# Patient Record
Sex: Male | Born: 1985 | Race: Black or African American | Hispanic: No | Marital: Single | Smoking: Current some day smoker
Health system: Southern US, Community
[De-identification: ages and names within clinical notes are randomized; demographics above are authoritative.]

## PROBLEM LIST (undated history)

## (undated) DIAGNOSIS — R51 Headache: Secondary | ICD-10-CM

## (undated) DIAGNOSIS — R519 Headache, unspecified: Secondary | ICD-10-CM

## (undated) DIAGNOSIS — S86819A Strain of other muscle(s) and tendon(s) at lower leg level, unspecified leg, initial encounter: Secondary | ICD-10-CM

## (undated) DIAGNOSIS — F209 Schizophrenia, unspecified: Secondary | ICD-10-CM

## (undated) DIAGNOSIS — W3400XA Accidental discharge from unspecified firearms or gun, initial encounter: Secondary | ICD-10-CM

## (undated) DIAGNOSIS — R569 Unspecified convulsions: Secondary | ICD-10-CM

## (undated) HISTORY — PX: LEG SURGERY: SHX1003

## (undated) HISTORY — PX: EYE SURGERY: SHX253

---
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
## 2005-01-02 ENCOUNTER — Encounter: Admission: RE | Admit: 2005-01-02 | Discharge: 2005-01-02 | Payer: Self-pay | Admitting: Occupational Medicine

## 2005-02-19 ENCOUNTER — Emergency Department (HOSPITAL_COMMUNITY): Admission: EM | Admit: 2005-02-19 | Discharge: 2005-02-19 | Payer: Self-pay | Admitting: Emergency Medicine

## 2005-02-20 ENCOUNTER — Ambulatory Visit: Payer: Self-pay | Admitting: Psychiatry

## 2005-02-20 ENCOUNTER — Inpatient Hospital Stay (HOSPITAL_COMMUNITY): Admission: EM | Admit: 2005-02-20 | Discharge: 2005-02-23 | Payer: Self-pay | Admitting: Psychiatry

## 2008-03-29 ENCOUNTER — Ambulatory Visit: Payer: Self-pay | Admitting: Family Medicine

## 2008-03-29 DIAGNOSIS — Z8659 Personal history of other mental and behavioral disorders: Secondary | ICD-10-CM | POA: Insufficient documentation

## 2009-04-15 ENCOUNTER — Ambulatory Visit: Payer: Self-pay | Admitting: Family Medicine

## 2009-04-15 ENCOUNTER — Encounter: Payer: Self-pay | Admitting: Family Medicine

## 2009-04-15 DIAGNOSIS — R630 Anorexia: Secondary | ICD-10-CM

## 2009-04-15 DIAGNOSIS — R634 Abnormal weight loss: Secondary | ICD-10-CM

## 2009-04-15 LAB — CONVERTED CEMR LAB
ALT: 9 units/L (ref 0–53)
AST: 12 units/L (ref 0–37)
Albumin: 4.2 g/dL (ref 3.5–5.2)
Alkaline Phosphatase: 60 units/L (ref 39–117)
BUN: 13 mg/dL (ref 6–23)
CO2: 29 meq/L (ref 19–32)
Calcium: 9.3 mg/dL (ref 8.4–10.5)
Chloride: 103 meq/L (ref 96–112)
Cholesterol: 119 mg/dL (ref 0–200)
Creatinine, Ser: 1.16 mg/dL (ref 0.40–1.50)
Glucose, Bld: 91 mg/dL (ref 70–99)
HCT: 40.2 % (ref 39.0–52.0)
HDL: 38 mg/dL — ABNORMAL LOW (ref >39)
Hemoglobin: 14 g/dL (ref 13.0–17.0)
LDL Cholesterol: 73 mg/dL (ref 0–99)
MCHC: 34.8 g/dL (ref 30.0–36.0)
MCV: 100.5 fL — ABNORMAL HIGH (ref 78.0–100.0)
Platelets: 204 10*3/uL (ref 150–400)
Potassium: 3.9 meq/L (ref 3.5–5.3)
RBC: 4 M/uL — ABNORMAL LOW (ref 4.22–5.81)
RDW: 12.4 % (ref 11.5–15.5)
Sodium: 142 meq/L (ref 135–145)
TSH: 0.525 microintl units/mL (ref 0.350–4.500)
Total Bilirubin: 0.6 mg/dL (ref 0.3–1.2)
Total CHOL/HDL Ratio: 3.1
Total Protein: 6.7 g/dL (ref 6.0–8.3)
Triglycerides: 39 mg/dL (ref <150)
VLDL: 8 mg/dL (ref 0–40)
WBC: 4.1 10*3/uL (ref 4.0–10.5)

## 2009-04-27 ENCOUNTER — Encounter: Payer: Self-pay | Admitting: Family Medicine

## 2009-08-02 ENCOUNTER — Encounter (INDEPENDENT_AMBULATORY_CARE_PROVIDER_SITE_OTHER): Payer: Self-pay | Admitting: *Deleted

## 2009-08-02 DIAGNOSIS — F17200 Nicotine dependence, unspecified, uncomplicated: Secondary | ICD-10-CM

## 2011-01-10 ENCOUNTER — Encounter: Payer: Self-pay | Admitting: *Deleted

## 2011-04-12 ENCOUNTER — Inpatient Hospital Stay (INDEPENDENT_AMBULATORY_CARE_PROVIDER_SITE_OTHER)
Admission: RE | Admit: 2011-04-12 | Discharge: 2011-04-12 | Disposition: A | Discharge status: Home / Self Care | Payer: Medicaid Other | Source: Ambulatory Visit | Attending: Emergency Medicine | Admitting: Emergency Medicine

## 2011-04-12 DIAGNOSIS — B86 Scabies: Secondary | ICD-10-CM

## 2012-06-14 ENCOUNTER — Emergency Department (HOSPITAL_COMMUNITY)
Admission: EM | Admit: 2012-06-14 | Discharge: 2012-06-14 | Disposition: A | Discharge status: Home / Self Care | Payer: Medicaid Other | Attending: Emergency Medicine | Admitting: Emergency Medicine

## 2012-06-14 DIAGNOSIS — L600 Ingrowing nail: Secondary | ICD-10-CM | POA: Insufficient documentation

## 2012-06-14 DIAGNOSIS — IMO0002 Reserved for concepts with insufficient information to code with codable children: Secondary | ICD-10-CM

## 2012-06-14 DIAGNOSIS — B351 Tinea unguium: Secondary | ICD-10-CM

## 2012-06-14 MED ORDER — TRAMADOL HCL 50 MG PO TABS
50.0000 mg | ORAL_TABLET | Freq: Once | ORAL | Status: AC
  Administered 2012-06-14: 50 mg via ORAL
  Filled 2012-06-14: qty 1

## 2012-06-14 MED ORDER — TRAMADOL HCL 50 MG PO TABS: 50.0000 mg | ORAL_TABLET | Freq: Four times a day (QID) | ORAL | Status: AC | PRN

## 2012-06-14 MED ORDER — CICLOPIROX & LACQUER REMOVAL 8 % EX KIT: 1.0000 "application " | PACK | Freq: Two times a day (BID) | CUTANEOUS | Status: DC

## 2012-06-14 MED ORDER — SULFAMETHOXAZOLE-TRIMETHOPRIM 800-160 MG PO TABS: 1.0000 | ORAL_TABLET | Freq: Two times a day (BID) | ORAL | Status: AC

## 2012-06-14 NOTE — ED Notes (Signed)
sts he has an infected toenail, hx of toe inj, sts stumped toe yesterday and now purple

## 2012-06-14 NOTE — ED Provider Notes (Signed)
History   This chart was scribed for Jerry Munch, MD by Charolett Bumpers . The patient was seen in room TR09C/TR09C. Patient's care was started at 1342.    CSN: [id number]  Arrival date & time 06/14/12  1323   First MD Initiated Contact with Patient 06/14/12 1342      Chief Complaint  Patient presents with  . Ingrown Toenail    (Consider location/radiation/quality/duration/timing/severity/associated sxs/prior treatment) HPI MANSOUR BALBOA is a 26 y.o. male who presents to the Emergency Department complaining of constant, moderate right great toe pain that with increasing pain since yesterday after stumping his toe. Pt reports associated swelling and ecchymosis. Pt reports that he has had toe pain and a fungal growth over the past year. Pt denies taking any medications for pain. Pt denies any prior medical hx. Pt denies any recent n/v, fevers or chills. Pt denies any h/o HTN or diabetes. Pt denies seeing a podiatrist previously for these symptoms. Pt states that he believes that his toenail  is infected.   No past medical history on file.  No past surgical history on file.  No family history on file.  History  Substance Use Topics  . Smoking status: Not on file  . Smokeless tobacco: Not on file  . Alcohol Use: Not on file      Review of Systems  Constitutional:       Per HPI, otherwise negative  HENT:       Per HPI, otherwise negative  Eyes: Negative.   Respiratory:       Per HPI, otherwise negative  Cardiovascular:       Per HPI, otherwise negative  Gastrointestinal: Negative for vomiting.  Genitourinary: Negative.   Musculoskeletal: Positive for arthralgias.       Per HPI, otherwise negative  Skin: Negative.   Neurological: Negative for syncope.    Allergies  Review of patient's allergies indicates no known allergies.  Home Medications   Current Outpatient Rx  Name Route Sig Dispense Refill  . BENZTROPINE MESYLATE 1 MG PO TABS Oral Take 1 mg  by mouth 2 (two) times daily.      BP 125/65  Pulse 57  Temp 98.9 F (37.2 C) (Oral)  Resp 18  SpO2 98%  Physical Exam  Nursing note and vitals reviewed. Constitutional: He is oriented to person, place, and time. He appears well-developed. No distress.  HENT:  Head: Normocephalic and atraumatic.  Eyes: Conjunctivae and EOM are normal.  Cardiovascular: Normal rate, regular rhythm and intact distal pulses.   Pulmonary/Chest: Effort normal. No stridor. No respiratory distress.  Abdominal: He exhibits no distension.  Musculoskeletal: He exhibits no edema.       Right great toe, neurovascularly intact. Tenderness at base of right great toe with signs of infection. Fungal growth on great toenail.   Neurological: He is alert and oriented to person, place, and time.  Skin: Skin is warm and dry.  Psychiatric: He has a normal mood and affect.    ED Course  Procedures (including critical care time)  DIAGNOSTIC STUDIES: Oxygen Saturation is 98% on room air, normal by my interpretation.    COORDINATION OF CARE:  15:42-Discussed planned course of treatment with the patient including epsom salt soaks, fungal cream and abx, who is agreeable at this time.   16:00-Medication Orders: Tramadol (Ultram) tablet 500 mg-once.   Labs Reviewed - No data to display No results found.   No diagnosis found.    MDM  I personally performed  the services described in this documentation, which was scribed in my presence. The recorded information has been reviewed and considered.  The patient presents with concern over new changes to a chronically infected toe.  On exam the patient is in no distress with unremarkable vital signs, and no fever.  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  There is no fluctuance or drainable collection near the nail matrix, but there is concern for early paronychia.  I discussed all findings  with the patient.  He was discharged with return precautions, analgesics, antifungal cream, and podiatry followup.  Jerry Munch, MD 06/14/12 1710

## 2014-03-08 ENCOUNTER — Encounter (HOSPITAL_COMMUNITY): Payer: Self-pay | Admitting: Emergency Medicine

## 2014-03-08 ENCOUNTER — Emergency Department (HOSPITAL_COMMUNITY)
Admission: EM | Admit: 2014-03-08 | Discharge: 2014-03-08 | Disposition: A | Discharge status: Home / Self Care | Payer: Medicaid Other | Attending: Emergency Medicine | Admitting: Emergency Medicine

## 2014-03-08 DIAGNOSIS — K0381 Cracked tooth: Secondary | ICD-10-CM | POA: Insufficient documentation

## 2014-03-08 DIAGNOSIS — F17200 Nicotine dependence, unspecified, uncomplicated: Secondary | ICD-10-CM | POA: Insufficient documentation

## 2014-03-08 DIAGNOSIS — K029 Dental caries, unspecified: Secondary | ICD-10-CM

## 2014-03-08 DIAGNOSIS — Z8659 Personal history of other mental and behavioral disorders: Secondary | ICD-10-CM | POA: Insufficient documentation

## 2014-03-08 DIAGNOSIS — K089 Disorder of teeth and supporting structures, unspecified: Secondary | ICD-10-CM | POA: Insufficient documentation

## 2014-03-08 HISTORY — DX: Schizophrenia, unspecified: F20.9

## 2014-03-08 MED ORDER — OXYCODONE-ACETAMINOPHEN 5-325 MG PO TABS
2.0000 | ORAL_TABLET | Freq: Once | ORAL | Status: AC
  Administered 2014-03-08: 1 via ORAL
  Filled 2014-03-08: qty 2

## 2014-03-08 MED ORDER — IBUPROFEN 400 MG PO TABS
800.0000 mg | ORAL_TABLET | Freq: Once | ORAL | Status: AC
  Administered 2014-03-08: 800 mg via ORAL
  Filled 2014-03-08: qty 2

## 2014-03-08 MED ORDER — OXYCODONE-ACETAMINOPHEN 5-325 MG PO TABS: 1.0000 | ORAL_TABLET | Freq: Four times a day (QID) | ORAL | Status: DC | PRN

## 2014-03-08 MED ORDER — PENICILLIN V POTASSIUM 500 MG PO TABS: 500.0000 mg | ORAL_TABLET | Freq: Four times a day (QID) | ORAL | Status: DC

## 2014-03-08 NOTE — ED Provider Notes (Signed)
CSN: [id number]     Arrival date & time 03/08/14  1736 History  This chart was scribed for non-physician practitioner Junius FinnerErin O'Malley, PA-C working with Dagmar HaitWilliam Blair Walden, MD by Joaquin MusicKristina Sanchez-Matthews, ED Scribe. This patient was seen in room TR10C/TR10C and the patient's care was started at 6:58 PM .   Chief Complaint  Patient presents with  . Dental Injury   The history is provided by the patient. No language interpreter was used.   HPI Comments: Jerry [name] is a 28 y.o. male who presents to the Emergency Department complaining of dental pain to L lower molars due to dental injury that occurred this afternoon. Pain is constant, aching, throbbing, 10/10.  Mother of pt states he "chipped his tooth down to the nerve" while eating chips. Pt denies taking any OTC medications PTA. He denies being allergic to any medications. Pt denies drooling and facial swelling.  Past Medical History  Diagnosis Date  . Schizophrenia    History reviewed. No pertinent past surgical history. No family history on file. History  Substance Use Topics  . Smoking status: Current Every Day Smoker  . Smokeless tobacco: Not on file  . Alcohol Use: No    Review of Systems  HENT: Positive for dental problem. Negative for drooling and facial swelling.   Skin: Negative for wound.   Allergies  Review of patient's allergies indicates no known allergies.  Home Medications   Prior to Admission medications   Not on File   BP 134/99  Pulse 70  Temp(Src) 98 F (36.7 C) (Axillary)  Resp 17  Wt 189 lb (85.73 kg)  SpO2 100%  Physical Exam  Nursing note and vitals reviewed. Constitutional: He is oriented to person, place, and time. He appears well-developed and well-nourished.  HENT:  Head: Normocephalic and atraumatic.  Mouth/Throat:    Cracked tooth down to dentin. Tender to palpation. No discharge or bleeding. No foreign body. No surrounding edema.   Eyes: EOM are normal.  Neck: Normal range of  motion.  Cardiovascular: Normal rate.   Pulmonary/Chest: Effort normal.  Musculoskeletal: Normal range of motion.  Neurological: He is alert and oriented to person, place, and time.  Skin: Skin is warm and dry.  Psychiatric: He has a normal mood and affect. His behavior is normal.   ED Course  Procedures  DIAGNOSTIC STUDIES: Oxygen Saturation is 100% on RA, normal by my interpretation.    COORDINATION OF CARE: 7:00 PM-Discussed treatment plan which includes discharge pt with pain medication and antibiotics. Will give pt pain medication while in ED and discharge pt with a resource guide. Pt agreed to plan.   Labs Review Labs Reviewed - No data to display  Imaging Review No results found.   EKG Interpretation None     MDM   Final diagnoses:  Pain due to dental caries  Cracked tooth    Pt presenting to ED with dental pain. No evidence of dental abscess. Will tx with pain medication. Advised to f/u with Dr. Leanord AsalFarless, DDS. Return precautions provided. Pt verbalized understanding and agreement with tx plan.   I personally performed the services described in this documentation, which was scribed in my presence. The recorded information has been reviewed and is accurate.    Junius Finnerrin O'Malley, PA-C 03/08/14 1913

## 2014-03-08 NOTE — Discharge Instructions (Signed)
Dental Caries Dental caries is tooth decay. This decay can cause a hole in teeth (cavity) that can get bigger and deeper over time. HOME CARE  Brush and floss your teeth. Do this at least two times a day.  Use a fluoride toothpaste.  Use a mouth rinse if told by your dentist or doctor.  Eat less sugary and starchy foods. Drink less sugary drinks.  Avoid snacking often on sugary and starchy foods. Avoid sipping often on sugary drinks.  Keep regular checkups and cleanings with your dentist.  Use fluoride supplements if told by your dentist or doctor.  Allow fluoride to be applied to teeth if told by your dentist or doctor. MAKE SURE YOU:  Understand these instructions.  Will watch your condition.  Will get help right away if you are not doing well or get worse. Document Released: 08/14/2008 Document Revised: 07/08/2013 Document Reviewed: 11/07/2012 [facility] Patient Information 2014 [place]Care, [place]LLC.  Dental Fracture You have a dental fracture or injury. This can mean the tooth is loose, has a chip in the enamel or is broken. If just the outer enamel is chipped, there is a good chance the tooth will not become infected. The only treatment needed may be to smooth off a rough edge. Fractures into the deeper layers (dentin and pulp) cause greater pain and are more likely to become infected. These require you to see a dentist as soon as possible to save the tooth. Loose teeth may need to be wired or bonded with a plastic splint to hold them in place. A paste may be painted on the open area of the broken tooth to reduce the pain. Antibiotics and pain medicine may be prescribed. Choosing a soft or liquid diet and rinsing the mouth out with warm water after meals may be helpful. See your dentist as recommended. Failure to seek care or follow up with a dentist or other specialist as recommended could result in the loss of your tooth, infection, or permanent dental problems. SEEK MEDICAL CARE IF:    You have increased pain not controlled with medicines.  You have swelling around the tooth, in the face or neck.  You have bleeding which starts, continues, or gets worse.  You have a fever. Document Released: 12/13/2004 Document Revised: 01/28/2012 Document Reviewed: 09/27/2009 [facility] Patient Information 2014 [place]ExitCare, [place]LLC.  Emergency Department Resource Guide 1) Find a Doctor and Pay Out of Pocket Although you won't have to find out who is covered by your insurance plan, it is a good idea to ask around and get recommendations. You will then need to call the office and see if the doctor you have chosen will accept you as a new patient and what types of options they offer for patients who are self-pay. Some doctors offer discounts or will set up payment plans for their patients who do not have insurance, but you will need to ask so you aren't surprised when you get to your appointment.  2) Contact Your Local Health Department Not all health departments have doctors that can see patients for sick visits, but many do, so it is worth a call to see if yours does. If you don't know where your local health department is, you can check in your phone book. The CDC also has a tool to help you locate your state's health department, and many state websites also have listings of all of their local health departments.  3) Find a Walk-in Clinic If your illness is not likely to  be very severe or complicated, you may want to try a walk in clinic. These are popping up all over the country in pharmacies, drugstores, and shopping centers. They're usually staffed by nurse practitioners or physician assistants that have been trained to treat common illnesses and complaints. They're usually fairly quick and inexpensive. However, if you have serious medical issues or chronic medical problems, these are probably not your best option.  No Primary Care Doctor: - Call Health Connect at  [phone] - they can help  you locate a primary care doctor that  accepts your insurance, provides certain services, etc. - Physician Referral Service- [phone]  Chronic Pain Problems: Organization         Address  Phone   Notes  [facility]  [phone] Patients need to be referred by their primary care doctor.   Medication Assistance: Organization         Address  Phone   Notes  [facility] Medication [facility] [address] [place] [place], [place] [address] [phone] --Must be a resident of [facility] -- Must have NO insurance coverage whatsoever (no Medicaid/ Medicare, etc.) -- The pt. MUST have a primary care doctor that directs their care regularly and follows them in the community   Med[facility]ist  [phone]   [employer]  [phone]    Agencies that provide inexpensive medical care: Organization         Address                                                       Phone                                                                            Notes  [facility]  [phone]   [facility]    [phone]   [facility] [address] [place], [place] [address] [phone]   [facility] [address] [place], [place] [phone]   Planned Parenthood    [phone]   [facility]    [phone]   [facility]  [address], [place] Phone:  [phone], Fax:  [phone] Hours of Operation:  9 am - 6 pm, M-F.  Also accepts Medicaid/Medicare and self-pay.  [facility]  [address], [place] Phone: [phone], Fax: [phone]. Hours of Operation:  8:30 am - 5:30 pm, M-F.  Also accepts Medicaid and self-pay.  [facility] [place] [address], [place] Phone: [phone]   [facility] [address] Natasha Bence [place], [place] [phone], Ext. [phone]  Mondays & Thursdays: 7-9 AM.  First 15 patients are seen on a first come, first serve basis.    Medicaid-accepting [facility] Providers:  Organization         Address  Phone                               Notes  [facility] [address], [place] [phone] Also accepts self-pay patients.  [facility] [address], [place]  [phone](336) [phone]   [facility] [address], [place] [phone]) [phone]   [facility] [address] [place] [address], [place] [phone](336) [phone]   Renaye RakersVeita Bland [address], [place]   [phone] Only accepts [place] Access [place]icaid patients after they have their name applied to their card.   Self-Pay (no insurance) in [facility]:   Organization         Address                                                     Phone               Notes  Sickle Cell Patients, [facility] [address], [place] [phone]) [phone]   [facility] [address], [place] [phone](336) [phone]   [facility] [place]  [address], [place] [phone](336) [phone]   [facility]/Dr. Osei-Bonsu  [address] [place] [address], [place] or [phone], [place] [phone]) [phone] Phone number for both [place] and [place] locations is the same.  [facility] [address], [place] [phone]) [phone]   [facility] [place] [address] [place] [address], [place] or [address] [phone]) [phone]) [phone]   [facility] [address], [place] [phone](336) [phone], phone; [phone](336) [phone], fax Sees patients 1st and 3rd Saturday of every month.  Must not qualify for public or private insurance (i.e. Medicaid, Medicare, [place] Health Choice, Veterans' Benefits)  Household income should be no  more than 200% of the poverty level The clinic cannot treat you if you are pregnant or think you are pregnant  Sexually transmitted diseases are not treated at the clinic.    Dental Care: Organization         Address                                  Phone                       Notes  [facility] of [facility] [address], [place] [phone](336) [phone] Accepts children up to age 29 who are enrolled in [place]icaid or [place] Health Choice; pregnant women with a Medicaid card; and children who have applied for Medicaid or [place] Health Choice, but were declined, whose parents can pay a reduced fee at time of service.  [facility] of [facility] [place]  [address], [place] [phone]) [phone] Accepts children up to age 29 who are enrolled in [place]icaid or [place] Health Choice; pregnant women with a Medicaid card; and children who have applied for Medicaid or  Health Choice, but were declined, whose parents can pay a reduced fee at time of  service.  [facility]  [address] [place], [place] [phone] Patients are seen by appointment only. Walk-ins are not accepted. [facility] will see patients 16 years of age and older. Monday - Tuesday (8am-5pm) Most Wednesdays (8:30-5pm) $30 per visit, cash only  [facility] Access PRO[facility]  [address], [facility] [phone] Patients are seen by appointment only. Walk-ins are not accepted. [facility] will see patients 52 years of age and older. One Wednesday Evening (Monthly: Volunteer Based).  $30 per visit, cash only  [employer]  [phone] for adults; Children under age 43, call Graduate Pediatric Dentistry at [phone]. Children aged 68-14, please call [phone] to request a pediatric application.  Dental services are provided in all areas of dental care including fillings, crowns and bridges, complete and  partial dentures, implants, gum treatment, root canals, and extractions. Preventive care is also provided. Treatment is provided to both adults and children. Patients are selected via a lottery and there is often a waiting list.   [facility] [address] [place]  [phone] [email or web address]   [facility] [address] [place], [place] [phone], Ext. [phone] Second and Fourth Thursday of each month, opens at 6:30 AM; Clinic ends at 9 AM.  Patients are seen on a first-come first-served basis, and a limited number are seen during each clinic.   [facility]  [address] [place], [place] [phone]   Eligibility Requirements You must have lived in [place], [place], or [place] counties for at least the last three months.   You cannot be eligible for state or federal sponsored [place], including [employer], [place], or [employer].   You generally cannot be eligible for healthcare insurance through your employer.    How to apply: Eligibility screenings are held every Tuesday and Wednesday afternoon from 1:00 pm until 4:00 pm. You do not need an appointment for the interview!  [facility] [address] [place], [place] [phone]   [facility] Health Department  [phone]   [facility]  [phone]   [facility]  [phone]    Behavioral Health Resources in the Community: Intensive Outpatient Programs Organization         Address                                              Phone              Notes  [facility] Services [address] [place], [place] [phone]   [facility]patient [address], [place], [place] [phone]   [facility]: [facility] [address] [place], [place]  [phone]   [facility] Mental Health [address]  [place], [place] [phone] or [phone]     Substance Abuse Resources Organization         Address                                Phone  Notes  [facility] Drug Services  [phone]   [facility]  [phone]   [facility] [place]  [phone]   [facility]  [phone]   Residential &  Outpatient Substance Abuse Program  [phone]   Psychological Services Organization         Address                                  Phone                Notes  [facility] Health  [phone]   [facility] Services  [phone]   [facility] [phone] [place] [phone] or [phone]    Mobile Crisis Teams Organization         Address  Phone  Notes  [facility], Mobile Crisis Care Unit  [phone]   [facility]  [address] [place], [place] [phone]   Doristine Locks [address] [place] [phone]    Self-Help/Support Groups Organization         Address                         Phone             Notes  [facility] of [place] - variety of support groups  336- I7437963 Call for more information  Narcotics Anonymous (NA), [facility] [address], [employer] [place]  2 meetings at this location   Statistician         Address                                                    Phone              Notes  [facility] [address],    [place]  [phone]   [facility]  [address] [place] [address], [place], [place] [phone]   [facility] [address] [place], [place] [phone] Admissions: 8am-3pm M-F  [facility] [address],    [place], [place] [phone]   [facility] [address] [place], [place], [place] [phone]   [facility] [address],  [place], [place] [phone]   [facility] - Intensive Outpatient [address], [place],  [place] [phone]   [facility] ([facility]iction [facility] [address] [place]  [place], [place] [phone] or [phone]   [facility] ([facility]) [address], [place], [place] [phone] Accepts Medicaid  [facility] [address]  [place] [phone] Substance Abuse/Addiction Treatment   [facility] Organization         Address                                                            Phone                    Notes  [facility]  7244036614   Angie Fava, PhD [address] [place], [place]   [phone] or [phone]   [facility]   [address]  [address] [place], [place] [phone]   [facility] [address] [place], [place] [phone] Insurance/Medicaid/sponsorship through [facility] and Families [address]                                    [place], [place] [phone] Therapy/tele-psych/case  [facility] [address]   [place], [place] [phone]    Dr. Lolly Mustache  [phone]   [facility] [place]  [employer] [facility] Dept. 1) [address], [place] 2) [address], [place] 3)  [address], [place] [phone]  [phone]   [facility] Child Abuse Hotline (862)679-4109 or 3136053347 (After Hours)

## 2014-03-08 NOTE — ED Notes (Signed)
Pt reports eating prior to arrival and "cracking" bottom left molar- pt groaning in pain at present.

## 2014-03-11 NOTE — ED Provider Notes (Signed)
Medical screening examination/treatment/procedure(s) were performed by non-physician practitioner and as supervising physician I was immediately available for consultation/collaboration.   EKG Interpretation None        William Garlan Drewes, MD 03/11/14 1839 

## 2015-03-09 ENCOUNTER — Emergency Department (HOSPITAL_COMMUNITY)
Admission: EM | Admit: 2015-03-09 | Discharge: 2015-03-10 | Disposition: A | Discharge status: Other Acute Inpatient Hospital | Payer: Medicaid Other | Attending: Emergency Medicine | Admitting: Emergency Medicine

## 2015-03-09 ENCOUNTER — Encounter (HOSPITAL_COMMUNITY): Payer: Self-pay | Admitting: Emergency Medicine

## 2015-03-09 DIAGNOSIS — Z046 Encounter for general psychiatric examination, requested by authority: Secondary | ICD-10-CM | POA: Diagnosis present

## 2015-03-09 DIAGNOSIS — Z79899 Other long term (current) drug therapy: Secondary | ICD-10-CM | POA: Diagnosis not present

## 2015-03-09 DIAGNOSIS — F250 Schizoaffective disorder, bipolar type: Secondary | ICD-10-CM | POA: Diagnosis present

## 2015-03-09 DIAGNOSIS — Z720 Tobacco use: Secondary | ICD-10-CM | POA: Insufficient documentation

## 2015-03-09 DIAGNOSIS — F209 Schizophrenia, unspecified: Secondary | ICD-10-CM | POA: Diagnosis not present

## 2015-03-09 DIAGNOSIS — F319 Bipolar disorder, unspecified: Secondary | ICD-10-CM | POA: Diagnosis not present

## 2015-03-09 LAB — COMPREHENSIVE METABOLIC PANEL
ALT: 17 U/L (ref 0–53)
ANION GAP: 6 (ref 5–15)
AST: 22 U/L (ref 0–37)
Albumin: 4.4 g/dL (ref 3.5–5.2)
Alkaline Phosphatase: 83 U/L (ref 39–117)
BILIRUBIN TOTAL: 0.6 mg/dL (ref 0.3–1.2)
BUN: 14 mg/dL (ref 6–23)
CALCIUM: 9.3 mg/dL (ref 8.4–10.5)
CHLORIDE: 102 mmol/L (ref 96–112)
CO2: 31 mmol/L (ref 19–32)
Creatinine, Ser: 1.07 mg/dL (ref 0.50–1.35)
GFR calc non Af Amer: >90 mL/min (ref >90)
GLUCOSE: 106 mg/dL — AB (ref 70–99)
Potassium: 4 mmol/L (ref 3.5–5.1)
Sodium: 139 mmol/L (ref 135–145)
Total Protein: 7.7 g/dL (ref 6.0–8.3)

## 2015-03-09 LAB — ETHANOL

## 2015-03-09 LAB — CBC
HEMATOCRIT: 44.4 % (ref 39.0–52.0)
Hemoglobin: 15 g/dL (ref 13.0–17.0)
MCH: 34.4 pg — ABNORMAL HIGH (ref 26.0–34.0)
MCHC: 33.8 g/dL (ref 30.0–36.0)
MCV: 101.8 fL — ABNORMAL HIGH (ref 78.0–100.0)
Platelets: 209 10*3/uL (ref 150–400)
RBC: 4.36 MIL/uL (ref 4.22–5.81)
RDW: 12.3 % (ref 11.5–15.5)
WBC: 6.5 10*3/uL (ref 4.0–10.5)

## 2015-03-09 LAB — RAPID URINE DRUG SCREEN, HOSP PERFORMED
AMPHETAMINES: NOT DETECTED
BARBITURATES: NOT DETECTED
BENZODIAZEPINES: NOT DETECTED
Cocaine: NOT DETECTED
Opiates: NOT DETECTED
Tetrahydrocannabinol: NOT DETECTED

## 2015-03-09 LAB — SALICYLATE LEVEL

## 2015-03-09 LAB — ACETAMINOPHEN LEVEL: Acetaminophen (Tylenol), Serum: <10 ug/mL — ABNORMAL LOW (ref 10–30)

## 2015-03-09 MED ORDER — ZOLPIDEM TARTRATE 5 MG PO TABS: 5.0000 mg | ORAL_TABLET | Freq: Every evening | ORAL | Status: DC | PRN

## 2015-03-09 MED ORDER — ALUM & MAG HYDROXIDE-SIMETH 200-200-20 MG/5ML PO SUSP: 30.0000 mL | ORAL | Status: DC | PRN

## 2015-03-09 MED ORDER — ONDANSETRON HCL 4 MG PO TABS: 4.0000 mg | ORAL_TABLET | Freq: Three times a day (TID) | ORAL | Status: DC | PRN

## 2015-03-09 MED ORDER — ARIPIPRAZOLE ER 400 MG IM SUSR: 400.0000 mg | INTRAMUSCULAR | Status: DC

## 2015-03-09 MED ORDER — LORAZEPAM 1 MG PO TABS: 1.0000 mg | ORAL_TABLET | Freq: Three times a day (TID) | ORAL | Status: DC | PRN

## 2015-03-09 MED ORDER — NICOTINE 21 MG/24HR TD PT24: 21.0000 mg | MEDICATED_PATCH | Freq: Every day | TRANSDERMAL | Status: DC

## 2015-03-09 MED ORDER — IBUPROFEN 200 MG PO TABS: 600.0000 mg | ORAL_TABLET | Freq: Three times a day (TID) | ORAL | Status: DC | PRN
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
## 2015-03-09 NOTE — ED Notes (Signed)
Bed: WBH37 Expected date:  Expected time:  Means of arrival:  Comments: Tr 4 
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
## 2015-03-10 ENCOUNTER — Inpatient Hospital Stay (HOSPITAL_COMMUNITY)
Admission: EM | Admit: 2015-03-10 | Discharge: 2015-03-15 | DRG: 885 | Disposition: A | Discharge status: Home / Self Care | Payer: Medicaid Other | Source: Intra-hospital | Attending: Psychiatry | Admitting: Psychiatry

## 2015-03-10 ENCOUNTER — Encounter (HOSPITAL_COMMUNITY): Payer: Self-pay

## 2015-03-10 DIAGNOSIS — F1010 Alcohol abuse, uncomplicated: Secondary | ICD-10-CM | POA: Diagnosis present

## 2015-03-10 DIAGNOSIS — F419 Anxiety disorder, unspecified: Secondary | ICD-10-CM | POA: Diagnosis present

## 2015-03-10 DIAGNOSIS — G4700 Insomnia, unspecified: Secondary | ICD-10-CM | POA: Diagnosis present

## 2015-03-10 DIAGNOSIS — F17210 Nicotine dependence, cigarettes, uncomplicated: Secondary | ICD-10-CM | POA: Diagnosis present

## 2015-03-10 DIAGNOSIS — F250 Schizoaffective disorder, bipolar type: Secondary | ICD-10-CM

## 2015-03-10 DIAGNOSIS — F1210 Cannabis abuse, uncomplicated: Secondary | ICD-10-CM | POA: Diagnosis present

## 2015-03-10 DIAGNOSIS — F209 Schizophrenia, unspecified: Secondary | ICD-10-CM | POA: Insufficient documentation

## 2015-03-10 MED ORDER — TRAZODONE HCL 100 MG PO TABS: 100.0000 mg | ORAL_TABLET | Freq: Every day | ORAL | Status: DC

## 2015-03-10 MED ORDER — MAGNESIUM HYDROXIDE 400 MG/5ML PO SUSP: 30.0000 mL | Freq: Every day | ORAL | Status: DC | PRN

## 2015-03-10 MED ORDER — LORAZEPAM 2 MG/ML IJ SOLN: 2.0000 mg | Freq: Four times a day (QID) | INTRAMUSCULAR | Status: DC

## 2015-03-10 MED ORDER — OLANZAPINE 10 MG PO TBDP: 10.0000 mg | ORAL_TABLET | Freq: Three times a day (TID) | ORAL | Status: DC | PRN

## 2015-03-10 MED ORDER — LORAZEPAM 2 MG/ML IJ SOLN
2.0000 mg | Freq: Four times a day (QID) | INTRAMUSCULAR | Status: DC
  Filled 2015-03-10: qty 1

## 2015-03-10 MED ORDER — ALUM & MAG HYDROXIDE-SIMETH 200-200-20 MG/5ML PO SUSP: 30.0000 mL | ORAL | Status: DC | PRN

## 2015-03-10 MED ORDER — DIPHENHYDRAMINE HCL 50 MG/ML IJ SOLN
50.0000 mg | Freq: Once | INTRAMUSCULAR | Status: DC
  Filled 2015-03-10: qty 1

## 2015-03-10 MED ORDER — ARIPIPRAZOLE ER 400 MG IM SUSR: 400.0000 mg | INTRAMUSCULAR | Status: DC

## 2015-03-10 MED ORDER — LORAZEPAM 1 MG PO TABS: 1.0000 mg | ORAL_TABLET | ORAL | Status: DC | PRN

## 2015-03-10 MED ORDER — ZIPRASIDONE MESYLATE 20 MG IM SOLR: 20.0000 mg | INTRAMUSCULAR | Status: DC | PRN

## 2015-03-10 MED ORDER — ACETAMINOPHEN 325 MG PO TABS: 650.0000 mg | ORAL_TABLET | Freq: Four times a day (QID) | ORAL | Status: DC | PRN

## 2015-03-10 MED ORDER — BENZTROPINE MESYLATE 0.5 MG PO TABS
0.5000 mg | ORAL_TABLET | Freq: Two times a day (BID) | ORAL | Status: DC
  Administered 2015-03-11 – 2015-03-14 (×7): 0.5 mg via ORAL
  Filled 2015-03-10 (×9): qty 1

## 2015-03-10 MED ORDER — HALOPERIDOL LACTATE 5 MG/ML IJ SOLN
5.0000 mg | Freq: Once | INTRAMUSCULAR | Status: DC
  Filled 2015-03-10: qty 1

## 2015-03-10 MED ORDER — ARIPIPRAZOLE 5 MG PO TABS
5.0000 mg | ORAL_TABLET | Freq: Every day | ORAL | Status: DC
  Filled 2015-03-10: qty 1

## 2015-03-10 MED ORDER — ARIPIPRAZOLE 5 MG PO TABS
5.0000 mg | ORAL_TABLET | Freq: Every day | ORAL | Status: DC
  Administered 2015-03-11 – 2015-03-14 (×4): 5 mg via ORAL
  Filled 2015-03-10 (×5): qty 1

## 2015-03-10 MED ORDER — BENZTROPINE MESYLATE 1 MG PO TABS: 0.5000 mg | ORAL_TABLET | Freq: Two times a day (BID) | ORAL | Status: DC

## 2015-03-10 NOTE — ED Notes (Signed)
[employer] transport requested through [employer].

## 2015-03-10 NOTE — ED Notes (Signed)
Josephine NP aware and is going to speak to pt.

## 2015-03-10 NOTE — ED Notes (Signed)
Pt up in the hall wanting to leave.  IVC process explained to pt by CSW and this RN.  Pt does not seem to understand that the Dr. Has to release him from the IVC.  Pt declines PO medications ordered.

## 2015-03-10 NOTE — Consult Note (Signed)
[facility] Consult   Reason for Consult:  Schizoaffective disorder, Bipolar type Referring Physician: EDP Patient Identification: Jerry [name] MRN:  [id number] Principal Diagnosis: Schizoaffective disorder, bipolar type Diagnosis:   Patient Active Problem List   Diagnosis Date Noted  . Schizoaffective disorder, bipolar type [F25.0] 03/10/2015  . TOBACCO USER [Z72.0] 08/02/2009  . LOSS OF APPETITE [R63.0] 04/15/2009  . LOSS OF WEIGHT [R63.4] 04/15/2009  . PSYCHIATRIC DISORDER, HX OF [Z86.59] 03/29/2008    Total Time spent with patient: 1 hour  Subjective:   Jerry [name] is a 29 y.o. male patient admitted with Schizoaffective disorder, Bi[polar type.Marland Kitchen  HPI: AA male, 29 years old was evaluated for disorganized speech, rambling words with a hx of Schizoaffective disorder Bipolar type.  Patient was brought in by [facility] under IVC taken out by [facility].  Patient reported today that he has been off his Abilify injection for 4-5 months and gave no reason for not taking his injection.  Patient reported that he was hospitalized at [facility] for 5 days and while there he was given his Abilify injection.  Patient was brought  a danger to self and others.  Patient stated that he came in to the hospital to look for job and a Vitamin that cleanses body.   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  Patient reports that he sees the "Vibration of life".  Patient was last hospitalized at our [place] 2006.  Patient receives outpatient Psychiatric care at [facility].  He has been accepted for admission for safety and stabilization.Patient will be transferred as soon as room is available.  HPI Elements:   Location:  Schizoaffective disorder, Bipolar type. Quality:  severe. Severity:  severe. Timing:  acute. Duration:  Chronic mental illness. Context:  IVC by [facility] for disorganization.  Past Medical History:  Past Medical  History  Diagnosis Date  . Schizophrenia   . Bipolar disorder     Past Surgical History  Procedure Laterality Date  . Leg surgery Right    Family History: No family history on file. Social History:  History  Alcohol Use  . Yes     History  Drug Use  . Yes  . Special: Marijuana    History   Social History  . Marital Status: Single    Spouse Name: N/A  . Number of Children: N/A  . Years of Education: N/A   Social History Main Topics  . Smoking status: Current Every Day Smoker  . Smokeless tobacco: Not on file  . Alcohol Use: Yes  . Drug Use: Yes    Special: Marijuana  . Sexual Activity: Not on file   Other Topics Concern  . None   Social History Narrative   Additional Social History:    Pain Medications: See PTA List Prescriptions: See PTA List Over the Counter: See PTA List History of alcohol / drug use?: Yes Longest period of sobriety (when/how long): "Months at a time" Negative Consequences of Use:  (Denies) Withdrawal Symptoms:  (Denies) Name of Substance 1: THC 1 - Age of First Use: 7 1 - Amount (size/oz): "a quarter bag" 1 - Frequency: Daily 1 - Duration: 10+ years 1 - Last Use / Amount: Friday, 03/04/15                   Allergies:   Allergies  Allergen Reactions  . Risperdal [Risperidone] Other (See Comments)  On [facility] paperwork    Labs:  Results for orders placed or performed during the hospital encounter of 03/09/15 (from the past 48 hour(s))  Acetaminophen level     Status: Abnormal   Collection Time: 03/09/15  9:51 PM  Result Value Ref Range   Acetaminophen (Tylenol), Serum <10.0 (L) 10 - 30 ug/mL    Comment:        THERAPEUTIC CONCENTRATIONS VARY SIGNIFICANTLY. A RANGE OF 10-30 ug/mL MAY BE AN EFFECTIVE CONCENTRATION FOR MANY PATIENTS. HOWEVER, SOME ARE BEST TREATED AT CONCENTRATIONS OUTSIDE THIS RANGE. ACETAMINOPHEN CONCENTRATIONS >150 ug/mL AT 4 HOURS AFTER INGESTION AND >50 ug/mL AT 12 HOURS AFTER INGESTION  ARE OFTEN ASSOCIATED WITH TOXIC REACTIONS.   CBC     Status: Abnormal   Collection Time: 03/09/15  9:51 PM  Result Value Ref Range   WBC 6.5 4.0 - 10.5 K/uL   RBC 4.36 4.22 - 5.81 MIL/uL   Hemoglobin 15.0 13.0 - 17.0 g/dL   HCT 44.4 39.0 - 52.0 %   MCV 101.8 (H) 78.0 - 100.0 fL   MCH 34.4 (H) 26.0 - 34.0 pg   MCHC 33.8 30.0 - 36.0 g/dL   RDW 12.3 11.5 - 15.5 %   Platelets 209 150 - 400 K/uL  Comprehensive metabolic panel     Status: Abnormal   Collection Time: 03/09/15  9:51 PM  Result Value Ref Range   Sodium 139 135 - 145 mmol/L   Potassium 4.0 3.5 - 5.1 mmol/L   Chloride 102 96 - 112 mmol/L   CO2 31 19 - 32 mmol/L   Glucose, Bld 106 (H) 70 - 99 mg/dL   BUN 14 6 - 23 mg/dL   Creatinine, Ser 1.07 0.50 - 1.35 mg/dL   Calcium 9.3 8.4 - 10.5 mg/dL   Total Protein 7.7 6.0 - 8.3 g/dL   Albumin 4.4 3.5 - 5.2 g/dL   AST 22 0 - 37 U/L   ALT 17 0 - 53 U/L   Alkaline Phosphatase 83 39 - 117 U/L   Total Bilirubin 0.6 0.3 - 1.2 mg/dL   GFR calc non Af Amer >90 >90 mL/min   GFR calc Af Amer >90 >90 mL/min    Comment: (NOTE) The eGFR has been calculated using the CKD EPI equation. This calculation has not been validated in all clinical situations. eGFR's persistently <90 mL/min signify possible Chronic Kidney Disease.    Anion gap 6 5 - 15  Ethanol (ETOH)     Status: None   Collection Time: 03/09/15  9:51 PM  Result Value Ref Range   Alcohol, Ethyl (B) <5 0 - 9 mg/dL    Comment:        LOWEST DETECTABLE LIMIT FOR SERUM ALCOHOL IS 11 mg/dL FOR MEDICAL PURPOSES ONLY   Salicylate level     Status: None   Collection Time: 03/09/15  9:51 PM  Result Value Ref Range   Salicylate Lvl <9.1 2.8 - 20.0 mg/dL  Urine Drug Screen     Status: None   Collection Time: 03/09/15  9:53 PM  Result Value Ref Range   Opiates NONE DETECTED NONE DETECTED   Cocaine NONE DETECTED NONE DETECTED   Benzodiazepines NONE DETECTED NONE DETECTED   Amphetamines NONE DETECTED NONE DETECTED    Tetrahydrocannabinol NONE DETECTED NONE DETECTED   Barbiturates NONE DETECTED NONE DETECTED    Comment:        DRUG SCREEN FOR MEDICAL PURPOSES ONLY.  IF CONFIRMATION IS NEEDED FOR ANY PURPOSE, NOTIFY LAB  WITHIN 5 DAYS.        LOWEST DETECTABLE LIMITS FOR URINE DRUG SCREEN Drug Class       Cutoff (ng/mL) Amphetamine      1000 Barbiturate      200 Benzodiazepine   275 Tricyclics       170 Opiates          300 Cocaine          300 THC              50     Vitals: Blood pressure 118/72, pulse 81, temperature 98.6 F (37 C), temperature source Oral, resp. rate 17, SpO2 99 %.  Risk to Self: Suicidal Ideation: No Suicidal Intent: No Is patient at risk for suicide?: No Suicidal Plan?: No Access to Means: No What has been your use of drugs/alcohol within the last 12 months?: Daily THC use, occasional Etoh use How many times?:  (na) Other Self Harm Risks: Very psychotic Intentional Self Injurious Behavior: None Risk to Others: Homicidal Ideation: No Thoughts of Harm to Others: No (However, IVC paperwork states pt was aggressive at [facility]) Current Homicidal Intent: No Current Homicidal Plan: No Access to Homicidal Means: No History of harm to others?: No Assessment of Violence: None Noted Does patient have access to weapons?: No Criminal Charges Pending?: No Does patient have a court date: No Prior Inpatient Therapy: Prior Inpatient Therapy: Yes Prior Therapy Dates: 02/2005; And "as a kid" Prior Therapy Facilty/Provider(s): [place] and others unknown Reason for Treatment: SI; Psychosis Prior Outpatient Therapy: Prior Outpatient Therapy: No Prior Therapy Dates: na Prior Therapy Facilty/Provider(s): na Reason for Treatment: na  Current Facility-Administered Medications  Medication Dose Route Frequency Provider Last Rate Last Dose  . alum & mag hydroxide-simeth (MAALOX/MYLANTA) 200-200-20 MG/5ML suspension 30 mL  30 mL Oral PRN Larene Pickett, PA-C      . ARIPiprazole (ABILIFY)  tablet 5 mg  5 mg Oral Daily Dlisa Barnwell      . [START ON 03/28/2015] ARIPiprazole SUSR 400 mg  400 mg Intramuscular Q30 days Marvelous Woolford      . benztropine (COGENTIN) tablet 0.5 mg  0.5 mg Oral BID Marquasia Schmieder      . ibuprofen (ADVIL,MOTRIN) tablet 600 mg  600 mg Oral Q8H PRN Larene Pickett, PA-C      . LORazepam (ATIVAN) tablet 1 mg  1 mg Oral Q8H PRN Larene Pickett, PA-C      . nicotine (NICODERM CQ - dosed in mg/24 hours) patch 21 mg  21 mg Transdermal Daily Larene Pickett, PA-C   21 mg at 03/10/15 1029  . OLANZapine zydis (ZYPREXA) disintegrating tablet 10 mg  10 mg Oral Q8H PRN Jamel Holzmann      . ondansetron (ZOFRAN) tablet 4 mg  4 mg Oral Q8H PRN Larene Pickett, PA-C      . zolpidem [facility]) tablet 5 mg  5 mg Oral QHS PRN Larene Pickett, PA-C       Current Outpatient Prescriptions  Medication Sig Dispense Refill  . ARIPiprazole 400 MG SUSR Inject 400 mg into the muscle every 30 (thirty) days.    Marland Kitchen oxyCODONE-acetaminophen (PERCOCET/ROXICET) 5-325 MG per tablet Take 1-2 tablets by mouth every 6 (six) hours as needed for severe pain. (Patient not taking: Reported on 03/09/2015) 10 tablet 0  . penicillin v potassium (VEETID) 500 MG tablet Take 1 tablet (500 mg total) by mouth 4 (four) times daily. (Patient not taking: Reported on 03/09/2015) 40 tablet  0    Musculoskeletal: Strength & Muscle Tone: within normal limits Gait & Station: normal Patient leans: N/A  Psychiatric Specialty Exam:     Blood pressure 118/72, pulse 81, temperature 98.6 F (37 C), temperature source Oral, resp. rate 17, SpO2 99 %.There is no weight on file to calculate BMI.  General Appearance: Casual and Fairly Groomed  Eye Contact::  Good  Speech:  Clear and Coherent and Normal Rate  Volume:  Normal  Mood:  Anxious and Depressed  Affect:  Congruent, Depressed and Flat  Thought Process:  Circumstantial, Disorganized and Tangential  Orientation:  Full (Time, Place, and Person)  Thought Content:   Hallucinations: Auditory Visual  Suicidal Thoughts:  No  Homicidal Thoughts:  No  Memory:  Immediate;   Good Recent;   Fair Remote;   Fair  Judgement:  Impaired  Insight:  Shallow  Psychomotor Activity:  Restlessness  Concentration:  Poor  Recall:  NA  Fund of Knowledge:Fair  Language: Fair  Akathisia:  NA  Handed:  Right  AIMS (if indicated):     Assets:  Desire for Improvement  ADL's:  Intact  Cognition: Impaired,  Severe  Sleep:      Medical Decision Making: Review of Psycho-Social Stressors (1), Established Problem, Worsening (2), Review of Medication Regimen & Side Effects (2) and Review of New Medication or Change in Dosage (2)  Treatment Plan Summary: Daily contact with patient to assess and evaluate symptoms and progress in treatment, Medication management and Plan accepted for admission and will be transferred when bed is avaialbale.  Plan:  Recommend psychiatric Inpatient admission when medically cleared. Disposition: see above  Delfin Gant 03/10/2015 12:34 PM Patient seen face-to-face for psychiatric evaluation, chart reviewed and case discussed with the physician extender and developed treatment plan. Reviewed the information documented and agree with the treatment plan. Corena Pilgrim, MD
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
## 2015-03-10 NOTE — ED Notes (Signed)
Pt resting in bed with no s/s of distress noted currently. Pt with dull, flat affect endorsing depression. Pt declines scheduled Ativan injection at this time. Pt states he feels ok now and is no longer angry or anxious. Pt pleasant and cooperative with assessment.

## 2015-03-10 NOTE — ED Notes (Addendum)
Patient becoming agitated upon [facility]. Pt demanding to have all of the staff members first and last names, including [employer] officer. Pt states "I have rights and I can leave if I want to! I ain't crazy. Ya'll think I'm crazy, but I ain't!"

## 2015-03-10 NOTE — BH Assessment (Deleted)
The following facilities have been contacted in an effort to place this patient, with results as noted:  Beds available, information faxed, [facility] pending[facility]    [place]

## 2015-03-10 NOTE — ED Notes (Signed)
Pt with IVC papers, presents for medical clearance.  IVC papers report, pt SI and unable to contract for safety.  During assessment pt denies SI, HI or AV hallucinations, denies feeling hopeless.  Admits to Marijuana use and occasional alcohol use.  Pt reports he has missed his Abilify injection for past 4-5 mos.  Pt calm & cooperative at present, AAO x 3, no distress noted.  Monitoring for safety, Q 15 min checks in effect.

## 2015-03-10 NOTE — [facility] Counselor (Signed)
Per Donell SievertSpencer Simon, PA, pt meets inpt criteria. TTS will seek placement elsewhere due to pt's hx of aggression.    Cyndie MullAnna Mallarie Voorhies, [facility] Triage Specialist

## 2015-03-11 ENCOUNTER — Other Ambulatory Visit: Payer: Self-pay

## 2015-03-11 ENCOUNTER — Encounter (HOSPITAL_COMMUNITY): Payer: Self-pay | Admitting: Psychiatry

## 2015-03-11 DIAGNOSIS — F250 Schizoaffective disorder, bipolar type: Principal | ICD-10-CM

## 2015-03-11 MED ORDER — DIVALPROEX SODIUM ER 500 MG PO TB24
500.0000 mg | ORAL_TABLET | Freq: Every day | ORAL | Status: DC
  Administered 2015-03-11 – 2015-03-14 (×4): 500 mg via ORAL
  Filled 2015-03-11 (×4): qty 1
  Filled 2015-03-11: qty 3
  Filled 2015-03-11: qty 1

## 2015-03-11 MED ORDER — TRAZODONE HCL 100 MG PO TABS
100.0000 mg | ORAL_TABLET | Freq: Every evening | ORAL | Status: DC | PRN
  Administered 2015-03-11 – 2015-03-14 (×7): 100 mg via ORAL
  Filled 2015-03-11 (×7): qty 1
  Filled 2015-03-11: qty 6
  Filled 2015-03-11 (×2): qty 1

## 2015-03-11 MED ORDER — HYDROXYZINE HCL 25 MG PO TABS
25.0000 mg | ORAL_TABLET | Freq: Three times a day (TID) | ORAL | Status: DC | PRN
  Administered 2015-03-11 – 2015-03-14 (×5): 25 mg via ORAL
  Filled 2015-03-11 (×6): qty 1

## 2015-03-11 MED ORDER — LORAZEPAM 2 MG/ML IJ SOLN: 2.0000 mg | Freq: Four times a day (QID) | INTRAMUSCULAR | Status: DC | PRN

## 2015-03-11 MED ORDER — OLANZAPINE 10 MG PO TBDP
10.0000 mg | ORAL_TABLET | Freq: Three times a day (TID) | ORAL | Status: DC | PRN
  Administered 2015-03-14: 10 mg via ORAL
  Filled 2015-03-11: qty 1

## 2015-03-11 MED ORDER — NICOTINE 21 MG/24HR TD PT24
21.0000 mg | MEDICATED_PATCH | Freq: Every day | TRANSDERMAL | Status: DC
Start: 2015-03-11 — End: 2015-03-15
  Administered 2015-03-11 – 2015-03-15 (×5): 21 mg via TRANSDERMAL
  Filled 2015-03-11: qty 1
  Filled 2015-03-11: qty 14
  Filled 2015-03-11 (×5): qty 1

## 2015-03-11 MED ORDER — LORAZEPAM 1 MG PO TABS
2.0000 mg | ORAL_TABLET | Freq: Four times a day (QID) | ORAL | Status: DC | PRN
  Administered 2015-03-11: 2 mg via ORAL
  Filled 2015-03-11: qty 2

## 2015-03-11 NOTE — [facility] Suicide Risk Assessment (Signed)
[facility]   Nursing information obtained from:    Demographic factors:    Current Mental Status:    Loss Factors:    Historical Factors:    Risk Reduction Factors:    Total Time spent with patient: 30 minutes Principal Problem: Schizoaffective disorder, bipolar type Diagnosis:   Patient Active Problem List   Diagnosis Date Noted  . Schizoaffective disorder, bipolar type [F25.0] 03/10/2015  . TOBACCO USER [Z72.0] 08/02/2009     Continued Clinical Symptoms:  Alcohol Use Disorder Identification Test Final Score (AUDIT): 9 The "Alcohol Use Disorders Identification Test", Guidelines for Use in Primary Care, Second Edition.  World Science writerHealth Organization [facility](WHO). Score between 0-7:  no or low risk or alcohol related problems. Score between 8-15:  moderate risk of alcohol related problems. Score between 16-19:  high risk of alcohol related problems. Score 20 or above:  warrants further diagnostic evaluation for alcohol dependence and treatment.   CLINICAL FACTORS:   Alcohol/Substance Abuse/Dependencies Unstable or Poor Therapeutic Relationship Previous Psychiatric Diagnoses and Treatments   Musculoskeletal: Strength & Muscle Tone: within normal limits Gait & Station: normal Patient leans: N/A  Psychiatric Specialty Exam: Physical Exam  Review of Systems  Psychiatric/Behavioral: Positive for depression. The patient is nervous/anxious.     Blood pressure 119/79, pulse 89, temperature 98.2 F (36.8 C), temperature source Oral, resp. rate 16, height 5\' 11"  (1.803 m), weight 83.462 kg (184 lb).Body mass index is 25.67 kg/(m^2).  Please see H&P.                                                        COGNITIVE FEATURES THAT CONTRIBUTE TO RISK:  Closed-mindedness, Polarized thinking and Thought constriction (tunnel vision)    SUICIDE RISK:   Mild:  Suicidal ideation of limited frequency, intensity, duration, and specificity.   There are no identifiable plans, no associated intent, mild dysphoria and related symptoms, good self-control (both objective and subjective assessment), few other risk factors, and identifiable protective factors, including available and accessible social support.  PLAN OF CARE: Please see H&P.   Medical Decision Making:  Review of Psycho-Social Stressors (1), Review or order clinical lab tests (1), Established Problem, Worsening (2), Review of Last Therapy Session (1), Review of Medication Regimen & Side Effects (2) and Review of New Medication or Change in Dosage (2)  I certify that inpatient services furnished can reasonably be expected to improve the patient's condition.   Tishie Altmann MD 03/11/2015, 12:13 PM

## 2015-03-11 NOTE — [facility] Group Notes (Signed)
[facility]   03/11/2015 1:26 PM  Participation Quality:  Active   Mood/Affect:  Flat  Depression Rating:  0  Anxiety Rating:  0  Thoughts of Suicide:  No Will you contract for safety?   NA  Current AVH:  No  Plan for Discharge/Comments:  Jerry [name] [facility] completed IVC paperwork. He reports not taking his medications for the last 4-5 months because he was busy. He listed duties, such as picking his daughter up from school and taking care of his family, as barriers to taking his medications. Pt plans to return home and follow up with outpatient.   Transportation Means: Bus or family   Supports: Family   Hyatt,Arneta Mahmood

## 2015-03-11 NOTE — Tx Team (Signed)
Initial Interdisciplinary Treatment Plan   PATIENT STRESSORS: Educational concerns Financial difficulties Medication change or noncompliance Substance abuse   PATIENT STRENGTHS: Active sense of humor General fund of knowledge Motivation for treatment/growth Physical Health Supportive family/friends   PROBLEM LIST: Problem List/Patient Goals Date to be addressed Date deferred Reason deferred Estimated date of resolution  Risk for suicide 03/11/15     aggression 03/11/15     Medication non-compliance 03/11/15     "getting right" 03/11/15                                    DISCHARGE CRITERIA:  Ability to meet basic life and health needs Adequate post-discharge living arrangements Improved stabilization in mood, thinking, and/or behavior Verbal commitment to aftercare and medication compliance  PRELIMINARY DISCHARGE PLAN: Attend aftercare/continuing care group Attend PHP/IOP Outpatient therapy  PATIENT/FAMIILY INVOLVEMENT: This treatment plan has been presented to and reviewed with the patient, Jerry [name].  The patient and family have been given the opportunity to ask questions and make suggestions.  Jacques Navyhillips, Lahoma Constantin A 03/11/2015, 2:46 AM
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
## 2015-03-11 NOTE — Plan of Care (Signed)
Problem: Ineffective individual coping Goal: STG: Patient will remain free from self harm Outcome: Progressing Patient remains free from self harm since admit, Q15 minute safety checks maintained

## 2015-03-11 NOTE — Progress Notes (Signed)
Patient ID: Jerry [name], male   DOB: 03/16/1986, 29 y.o.   MRN: [id number]  DAR: Pt. Denies SI/HI and A/V Hallucinations. Patient reports sleep is good, appetite is fair, energy level is high, and concentration level is good. Patient rates his depression and hopelessness 9/10 and anxiety 0/10. Patient does not report any pain or discomfort at this time. Support and encouragement provided to the patient. Scheduled medications administered to patient per physicians orders. No PRN medications administered to patient at this time. Will give as necessary. Patient is receptive and cooperative. Patient is seen in the milieu interacting with peers. Patient attended morning group. Q15 minute checks are maintained for safety.

## 2015-03-11 NOTE — H&P (Signed)
Psychiatric Admission Assessment Adult  Patient Identification: Jerry [name] MRN:  [id number] Date of Evaluation:  03/11/2015  Chief Complaint: ' Patient states 'I am sexually depressed '.     Principal Diagnosis: Schizoaffective disorder, bipolar type Diagnosis:   Patient Active Problem List   Diagnosis Date Noted  . Schizoaffective disorder, bipolar type [F25.0] 03/10/2015  . TOBACCO USER [Z72.0] 08/02/2009      History of Present Illness:: Jerry [name] is an 29 y.o. AA male, who is single , employed -does odd jobs , lives with his mother in [place] ,  presented to [facility] under IVC from [place]. Per initial notes in EHR , Pt was reportedly aggressive at [facility] earlier tonight when he went there to try and get back on his psych medications. Pt presented  with disorganized thought pattern and tangential speech. Pt is very delusional, exhibiting grandiose delusions and hyper-religiosity as well as paranoid ideations. He accuses people of stealing items from his room repeatedly. Pt states that he has been off of his Abilify for 5-6 months and that everything "goes downhill" when he gets off of his medication.  Pt seen this AM. Patient appeared to be very disorganized . Pt states that he is 'sexually depressed " and that he has not had sex in a long time . Pt states that all he did was go to [place] to get back on his medication the "abilify maintenna IM " and he ended up in the ED. Pt however is not able to elaborate on the events more than that. Pt states that he does have anxiety and sometimes he feels as though he is "bipolar". Pt does hear voices - states that he hears them everyday - "when I pray in the morning". Pt states that the voices tell him only positive things .   Pt denies SI/HI/VH/HI. Pt however appeared to be having an inappropriate affect as well as appeared to be disorganized during the evaluation.  Pt lives with his mother , has two children who are 75 and 62 y , who  live with his ex girlfriend who is the mother. 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   Pt reports abusing cannabis - when asked about the amount that he abuses - he states "enough" .   Pt reports occasional alcohol abuse . Denies any alcohol related problems or withdrawal sx.  Pt reports being admitted at [facility] in 2006 and [facility] in 2004. Pt denies any hx of suicide attempts.    Elements:  Location:  psychosis,anxiety. Quality:  AH , delusions , anxiety, mood swings , agitation, hypersexual and hyper religious. Severity:  severe. Timing:  acute. Duration:  past few days. Context:  hx of schizoaffective disorder. Associated Signs/Symptoms: Depression Symptoms:  psychomotor agitation, anxiety, (Hypo) Manic Symptoms:  Delusions, Distractibility, Hallucinations, Anxiety Symptoms:  anxiety unspecified Psychotic Symptoms:  Delusions, Hallucinations: Auditory PTSD Symptoms: NA Total Time spent with patient: 1 hour  Past Medical History: History reviewed. No pertinent past medical history.  Past Surgical History  Procedure Laterality Date  . Leg surgery Right    Family History:  Family History  Problem Relation Age of Onset  . Arthritis Mother   . Heart Problems Father    Social History:  History  Alcohol Use  . Yes     History  Drug Use  . Yes  . Special: Marijuana    History   Social History  . Marital Status: Single    Spouse Name: N/A  .  Number of Children: N/A  . Years of Education: N/A   Social History Main Topics  . Smoking status: Current Every Day Smoker -- 1.00 packs/day for 20 years    Types: Cigarettes  . Smokeless tobacco: Not on file  . Alcohol Use: Yes  . Drug Use: Yes    Special: Marijuana  . Sexual Activity: No   Other Topics Concern  . None   Social History Narrative   Additional Social History:       Patient was born in [place], raised by mother. Pt went up to 10 th grade. Pt is employed - does odd jobs .Pt has 2 children who are  with their mother . Pt denies ever being married . Pt is religious .                   Musculoskeletal: Strength & Muscle Tone: within normal limits Gait & Station: normal Patient leans: N/A  Psychiatric Specialty Exam: Physical Exam  Constitutional: He is oriented to person, place, and time. He appears well-developed and well-nourished.  HENT:  Head: Normocephalic and atraumatic.  Eyes: Conjunctivae and EOM are normal. Pupils are equal, round, and reactive to light.  Neck: Normal range of motion. Neck supple.  Cardiovascular: Normal rate and regular rhythm.   Respiratory: Effort normal and breath sounds normal.  GI: Soft.  Musculoskeletal: Normal range of motion.  Neurological: He is alert and oriented to person, place, and time.  Skin: Skin is warm.  Psychiatric: His behavior is normal. His mood appears anxious. His affect is labile and inappropriate. His speech is tangential. Thought content is delusional. Cognition and memory are normal. He expresses impulsivity. He exhibits a depressed mood.    Review of Systems  Constitutional: Negative.   HENT: Negative.   Eyes: Negative.   Respiratory: Negative.   Cardiovascular: Negative.   Gastrointestinal: Negative.   Genitourinary: Negative.   Musculoskeletal: Negative.   Skin: Negative.   Neurological: Negative.   Psychiatric/Behavioral: Positive for depression and substance abuse. Negative for suicidal ideas and hallucinations. The patient is nervous/anxious.     Blood pressure 119/79, pulse 89, temperature 98.2 F (36.8 C), temperature source Oral, resp. rate 16, height _0  (1.803 m), weight 83.462 kg (184 lb).Body mass index is 25.67 kg/(m^2).  General Appearance: Guarded  Eye Contact::  Fair  Speech:  Normal Rate  Volume:  Normal  Mood:  Euphoric  Affect:  Labile  Thought Process:  Disorganized and Irrelevant  Orientation:  Full (Time, Place, and Person)  Thought Content:  Delusions and Hallucinations:  Auditory  Suicidal Thoughts:  No  Homicidal Thoughts:  No  Memory:  Immediate;   Fair Recent;   Fair Remote;   Poor  Judgement:  Poor  Insight:  Shallow  Psychomotor Activity:  Restlessness  Concentration:  Poor  Recall:  [employer] of Knowledge:Fair  Language: Fair  Akathisia:  No  Handed:  Right  AIMS (if indicated):     Assets:  Physical Health Social Support  ADL's:  Intact  Cognition: WNL  Sleep:      Risk to Self: Is patient at risk for suicide?: No Risk to Others:  denies Prior Inpatient Therapy:  yes, [facility] Prior Outpatient Therapy:  [facility]  Alcohol Screening: 1. How often do you have a drink containing alcohol?: 2 to 4 times a month 2. How many drinks containing alcohol do you have on a typical day when you are drinking?: 10 or more 3. How often do you  have six or more drinks on one occasion?: Monthly Preliminary Score: 6 4. How often during the last year have you found that you were not able to stop drinking once you had started?: Less than monthly 5. How often during the last year have you failed to do what was normally expected from you becasue of drinking?: Never 6. How often during the last year have you needed a first drink in the morning to get yourself going after a heavy drinking session?: Never 7. How often during the last year have you had a feeling of guilt of remorse after drinking?: Never 8. How often during the last year have you been unable to remember what happened the night before because you had been drinking?: Never 9. Have you or someone else been injured as a result of your drinking?: No 10. Has a relative or friend or a doctor or another health worker been concerned about your drinking or suggested you cut down?: No Alcohol Use Disorder Identification Test Final Score (AUDIT): 9 Brief Intervention: Patient declined brief intervention  Allergies:   Allergies  Allergen Reactions  . Risperdal [Risperidone] Other (See Comments)    On  [facility] paperwork   Lab Results:  Results for orders placed or performed during the hospital encounter of 03/09/15 (from the past 48 hour(s))  Acetaminophen level     Status: Abnormal   Collection Time: 03/09/15  9:51 PM  Result Value Ref Range   Acetaminophen (Tylenol), Serum <10.0 (L) 10 - 30 ug/mL    Comment:        THERAPEUTIC CONCENTRATIONS VARY SIGNIFICANTLY. A RANGE OF 10-30 ug/mL MAY BE AN EFFECTIVE CONCENTRATION FOR MANY PATIENTS. HOWEVER, SOME ARE BEST TREATED AT CONCENTRATIONS OUTSIDE THIS RANGE. ACETAMINOPHEN CONCENTRATIONS >150 ug/mL AT 4 HOURS AFTER INGESTION AND >50 ug/mL AT 12 HOURS AFTER INGESTION ARE OFTEN ASSOCIATED WITH TOXIC REACTIONS.   CBC     Status: Abnormal   Collection Time: 03/09/15  9:51 PM  Result Value Ref Range   WBC 6.5 4.0 - 10.5 K/uL   RBC 4.36 4.22 - 5.81 MIL/uL   Hemoglobin 15.0 13.0 - 17.0 g/dL   HCT 44.4 39.0 - 52.0 %   MCV 101.8 (H) 78.0 - 100.0 fL   MCH 34.4 (H) 26.0 - 34.0 pg   MCHC 33.8 30.0 - 36.0 g/dL   RDW 12.3 11.5 - 15.5 %   Platelets 209 150 - 400 K/uL  Comprehensive metabolic panel     Status: Abnormal   Collection Time: 03/09/15  9:51 PM  Result Value Ref Range   Sodium 139 135 - 145 mmol/L   Potassium 4.0 3.5 - 5.1 mmol/L   Chloride 102 96 - 112 mmol/L   CO2 31 19 - 32 mmol/L   Glucose, Bld 106 (H) 70 - 99 mg/dL   BUN 14 6 - 23 mg/dL   Creatinine, Ser 1.07 0.50 - 1.35 mg/dL   Calcium 9.3 8.4 - 10.5 mg/dL   Total Protein 7.7 6.0 - 8.3 g/dL   Albumin 4.4 3.5 - 5.2 g/dL   AST 22 0 - 37 U/L   ALT 17 0 - 53 U/L   Alkaline Phosphatase 83 39 - 117 U/L   Total Bilirubin 0.6 0.3 - 1.2 mg/dL   GFR calc non Af Amer >90 >90 mL/min   GFR calc Af Amer >90 >90 mL/min    Comment: (NOTE) The eGFR has been calculated using the CKD EPI equation. This calculation has not been validated in all clinical  situations. eGFR's persistently <90 mL/min signify possible Chronic Kidney Disease.    Anion gap 6 5 - 15  Ethanol (ETOH)      Status: None   Collection Time: 03/09/15  9:51 PM  Result Value Ref Range   Alcohol, Ethyl (B) <5 0 - 9 mg/dL    Comment:        LOWEST DETECTABLE LIMIT FOR SERUM ALCOHOL IS 11 mg/dL FOR MEDICAL PURPOSES ONLY   Salicylate level     Status: None   Collection Time: 03/09/15  9:51 PM  Result Value Ref Range   Salicylate Lvl <8.1 2.8 - 20.0 mg/dL  Urine Drug Screen     Status: None   Collection Time: 03/09/15  9:53 PM  Result Value Ref Range   Opiates NONE DETECTED NONE DETECTED   Cocaine NONE DETECTED NONE DETECTED   Benzodiazepines NONE DETECTED NONE DETECTED   Amphetamines NONE DETECTED NONE DETECTED   Tetrahydrocannabinol NONE DETECTED NONE DETECTED   Barbiturates NONE DETECTED NONE DETECTED    Comment:        DRUG SCREEN FOR MEDICAL PURPOSES ONLY.  IF CONFIRMATION IS NEEDED FOR ANY PURPOSE, NOTIFY LAB WITHIN 5 DAYS.        LOWEST DETECTABLE LIMITS FOR URINE DRUG SCREEN Drug Class       Cutoff (ng/mL) Amphetamine      1000 Barbiturate      200 Benzodiazepine   017 Tricyclics       510 Opiates          300 Cocaine          300 THC              50    Current Medications: Current Facility-Administered Medications  Medication Dose Route Frequency Provider Last Rate Last Dose  . ARIPiprazole (ABILIFY) tablet 5 mg  5 mg Oral Daily Delfin Gant, NP   5 mg at 03/11/15 0849  . [START ON 03/28/2015] ARIPiprazole SUSR 400 mg  400 mg Intramuscular Q30 days Delfin Gant, NP      . benztropine (COGENTIN) tablet 0.5 mg  0.5 mg Oral BID Delfin Gant, NP   0.5 mg at 03/11/15 0849  . divalproex (DEPAKOTE ER) 24 hr tablet 500 mg  500 mg Oral QHS Shelbee Apgar, MD      . hydrOXYzine (ATARAX/VISTARIL) tablet 25 mg  25 mg Oral TID PRN Harriet Butte, NP   25 mg at 03/11/15 0045  . LORazepam (ATIVAN) tablet 2 mg  2 mg Oral Q6H PRN Harriet Butte, NP   2 mg at 03/11/15 0045   Or  . LORazepam (ATIVAN) injection 2 mg  2 mg Intramuscular Q6H PRN Harriet Butte, NP      .  nicotine (NICODERM CQ - dosed in mg/24 hours) patch 21 mg  21 mg Transdermal Daily Ursula Alert, MD   21 mg at 03/11/15 0849  . OLANZapine zydis (ZYPREXA) disintegrating tablet 10 mg  10 mg Oral Q8H PRN Delfin Gant, NP      . traZODone (DESYREL) tablet 100 mg  100 mg Oral QHS PRN Harriet Butte, NP   100 mg at 03/11/15 0045   PTA Medications: Prescriptions prior to admission  Medication Sig Dispense Refill Last Dose  . ARIPiprazole 400 MG SUSR Inject 400 mg into the muscle every 30 (thirty) days.   Past Week at Unknown time    Previous Psychotropic Medications: Yes , risperdal - caused gynecomastia  Substance Abuse  History in the last 12 months:  Yes.  cannabis abuse    Consequences of Substance Abuse: Negative  Results for orders placed or performed during the hospital encounter of 03/09/15 (from the past 72 hour(s))  Acetaminophen level     Status: Abnormal   Collection Time: 03/09/15  9:51 PM  Result Value Ref Range   Acetaminophen (Tylenol), Serum <10.0 (L) 10 - 30 ug/mL    Comment:        THERAPEUTIC CONCENTRATIONS VARY SIGNIFICANTLY. A RANGE OF 10-30 ug/mL MAY BE AN EFFECTIVE CONCENTRATION FOR MANY PATIENTS. HOWEVER, SOME ARE BEST TREATED AT CONCENTRATIONS OUTSIDE THIS RANGE. ACETAMINOPHEN CONCENTRATIONS >150 ug/mL AT 4 HOURS AFTER INGESTION AND >50 ug/mL AT 12 HOURS AFTER INGESTION ARE OFTEN ASSOCIATED WITH TOXIC REACTIONS.   CBC     Status: Abnormal   Collection Time: 03/09/15  9:51 PM  Result Value Ref Range   WBC 6.5 4.0 - 10.5 K/uL   RBC 4.36 4.22 - 5.81 MIL/uL   Hemoglobin 15.0 13.0 - 17.0 g/dL   HCT 44.4 39.0 - 52.0 %   MCV 101.8 (H) 78.0 - 100.0 fL   MCH 34.4 (H) 26.0 - 34.0 pg   MCHC 33.8 30.0 - 36.0 g/dL   RDW 12.3 11.5 - 15.5 %   Platelets 209 150 - 400 K/uL  Comprehensive metabolic panel     Status: Abnormal   Collection Time: 03/09/15  9:51 PM  Result Value Ref Range   Sodium 139 135 - 145 mmol/L   Potassium 4.0 3.5 - 5.1 mmol/L    Chloride 102 96 - 112 mmol/L   CO2 31 19 - 32 mmol/L   Glucose, Bld 106 (H) 70 - 99 mg/dL   BUN 14 6 - 23 mg/dL   Creatinine, Ser 1.07 0.50 - 1.35 mg/dL   Calcium 9.3 8.4 - 10.5 mg/dL   Total Protein 7.7 6.0 - 8.3 g/dL   Albumin 4.4 3.5 - 5.2 g/dL   AST 22 0 - 37 U/L   ALT 17 0 - 53 U/L   Alkaline Phosphatase 83 39 - 117 U/L   Total Bilirubin 0.6 0.3 - 1.2 mg/dL   GFR calc non Af Amer >90 >90 mL/min   GFR calc Af Amer >90 >90 mL/min    Comment: (NOTE) The eGFR has been calculated using the CKD EPI equation. This calculation has not been validated in all clinical situations. eGFR's persistently <90 mL/min signify possible Chronic Kidney Disease.    Anion gap 6 5 - 15  Ethanol (ETOH)     Status: None   Collection Time: 03/09/15  9:51 PM  Result Value Ref Range   Alcohol, Ethyl (B) <5 0 - 9 mg/dL    Comment:        LOWEST DETECTABLE LIMIT FOR SERUM ALCOHOL IS 11 mg/dL FOR MEDICAL PURPOSES ONLY   Salicylate level     Status: None   Collection Time: 03/09/15  9:51 PM  Result Value Ref Range   Salicylate Lvl <2.9 2.8 - 20.0 mg/dL  Urine Drug Screen     Status: None   Collection Time: 03/09/15  9:53 PM  Result Value Ref Range   Opiates NONE DETECTED NONE DETECTED   Cocaine NONE DETECTED NONE DETECTED   Benzodiazepines NONE DETECTED NONE DETECTED   Amphetamines NONE DETECTED NONE DETECTED   Tetrahydrocannabinol NONE DETECTED NONE DETECTED   Barbiturates NONE DETECTED NONE DETECTED    Comment:        DRUG SCREEN FOR MEDICAL PURPOSES  ONLY.  IF CONFIRMATION IS NEEDED FOR ANY PURPOSE, NOTIFY LAB WITHIN 5 DAYS.        LOWEST DETECTABLE LIMITS FOR URINE DRUG SCREEN Drug Class       Cutoff (ng/mL) Amphetamine      1000 Barbiturate      200 Benzodiazepine   774 Tricyclics       128 Opiates          300 Cocaine          300 THC              50     Observation Level/Precautions:  15 minute checks  Laboratory:  .[facility] Will get Hba1c,EKG,TSH,lipid panel,UDS ,CMP,CBC if not  already done Will also get Vitamin B12, Folate level , MCV being elevated   Psychotherapy:  Individual and group therapy   Medications:  As needed  Consultations:  Education officer, museum  Discharge Concerns:  Stability and safety  Estimated LOS:  Other:     Psychological Evaluations: No   Treatment Plan Summary: Daily contact with patient to assess and evaluate symptoms and progress in treatment and Medication management  Patient will benefit from inpatient treatment and stabilization.  Estimated length of stay is 5-7 days.  Reviewed past medical records,treatment plan.  Will continue Abilify 5 mg po daily for psychosis.Patient received his Abilify Maintenna 400 mg IM from [facility] in the past - not sure when he received it last. CSW will work on obtaining medical records from [place]. Will start Depakote ER 500 mg po qhs for mood lability. Will continue Trazodone 100 mg po qhs prn for sleep. Will make available prn medications for anxiety/agitation. Will continue to monitor vitals ,medication compliance and treatment side effects while patient is here.  Will monitor for medical issues as well as call consult as needed.  Reviewed labs ,will order as needed- see above . CSW will start working on disposition.  Patient to participate in therapeutic milieu .       Medical Decision Making:  Review of Psycho-Social Stressors (1), Review or order clinical lab tests (1), Review and summation of old records (2), Established Problem, Worsening (2), Review of Last Therapy Session (1), Review of Medication Regimen & Side Effects (2) and Review of New Medication or Change in Dosage (2)  I certify that inpatient services furnished can reasonably be expected to improve the patient's condition.   Jaianna Nicoll MD 4/22/201612:45 PM

## 2015-03-11 NOTE — Progress Notes (Signed)
Patient ID: Jerry [name], male   DOB: 1986-06-21, 29 y.o.   MRN: [id number] D: Patient in dayroom drawing. Pt reports bored with nothing to do. Pt reports he is not sure why he is here because he is taking medication as prescribed. Pt minimize his condition stating he will rather be at a bar than here on a friday night. Pt pleasant when talking to Probation officer. Cooperative with assessment.  A: Met with pt 1:1. Medications administered as prescribed. Writer encouraged pt to discuss feelings. writer encouraged pt to take treatment seriously.   R: Patient remains safe. He is complaint with medications and denies any adverse reaction. Pt denies SI/HI/AVH and pain. Pt attended evening wrap up group and engaged in discussion.

## 2015-03-11 NOTE — Tx Team (Signed)
Interdisciplinary Treatment Plan Update (Adult)  Date: 03/11/2015 Time Reviewed: 5:23 PM  Progress in Treatment:  Attending groups: Yes  Participating in groups: Yes  Taking medication as prescribed: Yes  Tolerating medication: Yes  Family/Significant other contact made: No, not yet  Patient understands diagnosis: No, limited insight  Discussing patient identified problems/goals with staff: Yes  Medical problems stabilized or resolved: Yes  Denies suicidal/homicidal ideation: Yes  Patient has not harmed self or Others: Yes  New problem(s) identified: NA Discharge Plan or Barriers: Pt plans to return home and receive outpatient services.   Additional comments:Jerry [name] is an 29 y.o. AA male, who is single , employed -does odd jobs , lives [place] mother in [place] , [facility] Per initial notes in EHR , [facility] when he went there to try and get back on his psych medications. Pt presented with disorganized thought pattern and tangential speech. Pt is very delusional, exhibiting grandiose delusions and hyper-religiosity as well as paranoid ideations. He accuses people of stealing items from his room repeatedly. Pt states that he has been off of his Abilify for 5-6 months and that everything "goes downhill" when he gets off of his medication. Patient appeared to be very disorganized . Pt states that he is 'sexually depressed " and that he has not had sex in a long time . Pt states that all he did was [facility] his medication the "abilify maintenna IM " and he ended up in the ED. Pt however is not able to elaborate on the events more than that. Pt states that he does have anxiety and sometimes he feels as though he is "bipolar". Pt does hear voices - states that he hears them everyday - "when I pray in the morning". Pt states that the voices tell him only positive things . Pt denies SI/HI/VH/HI. Pt  however appeared to be having an inappropriate affect as well as appeared to be disorganized during the evaluation.Pt reports abusing cannabis - when asked about the amount that he abuses - he states "enough" .  [facility] and [facility] in 2004.   Pt and CSW Intern reviewed pt's identified goals and treatment plan. Pt verbalized understanding and agreed to treatment plan  Reason for Continuation of Hospitalization:  Medication stabilization  Delusions Hallucinations     Estimated length of stay: 4-5 days   Attendees:  Patient:  03/11/2015 5:23 PM   Family:  4/22/20165:23 PM   Physician: Dr. Elna BreslowEappen MD  4/22/20165:23 PM  Nursing: Foy Guadalajarahrista, RN; HatilloPatty, RN  03/11/2015 5:23 PM  Clinical Social Worker: Daryel Geraldodney [place], [place]  4/22/20165:23 PM  Clinical Social Worker: Charleston Ropesandace Hyatt, CSW Intern 4/22/20165:23 PM  Other: Mercy RidingValerie, [facility]  4/22/20165:23 PM  Other: Ruta HindsDelora, [facility] 4/22/20165:23 PM  Other:  4/22/20165:23 PM  Scribe for Treatment Team:  Charleston RopesCandace Hyatt, CSW Intern 03/11/2015 5:23 PM

## 2015-03-11 NOTE — [facility] Group Notes (Signed)
[facility] LCSW Group Therapy  03/11/2015 1:34 PM  Type of Therapy:  Group Therapy  Participation Level:  Active  Participation Quality:  Attentive  Affect:  Flat  Cognitive:  Disorganized  Insight:  Limited  Engagement in Therapy:  Limited  Modes of Intervention:  Education, Socialization and Support  Summary of Progress/Problems:The chaplain facilitated group around themes such as courage and hope. Jerry [name] chose a picture of chess pieces. "Chess pieces because when you are playing chess you are not actually playing chess. Live the move. You have to learn how to move 5 steps ahead to actually know what you are going to go through. You have to think before you think."   Jerry [name],Jerry [name] 03/11/2015, 1:34 PM
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
## 2015-03-11 NOTE — Progress Notes (Signed)
[facility] Group Notes:  (Nursing/MHT/Case Management/Adjunct)  Date:  03/11/2015  Time:  9:35 PM  Type of Therapy:  Psychoeducational Skills  Participation Level:  Active  Participation Quality:  Attentive and Monopolizing  Affect:  Flat  Cognitive:  Disorganized  Insight:  Lacking  Engagement in Group:  Off Topic  Modes of Intervention:  Education  Summary of Progress/Problems: The patient shared with the group that he was simply grateful to be alive. The patient then talked in circles with religious references. As a theme for the day, his relapse prevention will include trying to think ahead and to focus on his strengths rather than on his weaknesses.   [name], Tawona Filsinger S 03/11/2015, 9:35 PM

## 2015-03-12 LAB — TSH: TSH: 0.881 u[IU]/mL (ref 0.350–4.500)

## 2015-03-12 LAB — GAMMA GT: GGT: 22 U/L (ref 7–51)

## 2015-03-12 LAB — LIPID PANEL
CHOLESTEROL: 149 mg/dL (ref 0–200)
HDL: 50 mg/dL (ref >39)
LDL Cholesterol: 84 mg/dL (ref 0–99)
Total CHOL/HDL Ratio: 3 RATIO
Triglycerides: 74 mg/dL (ref <150)
VLDL: 15 mg/dL (ref 0–40)

## 2015-03-12 LAB — VITAMIN B12: Vitamin B-12: 519 pg/mL (ref 211–911)

## 2015-03-12 NOTE — Progress Notes (Signed)
[facility] Group Notes:  (Nursing/MHT/Case Management/Adjunct)  Date:  03/12/2015  Time:  8:20 PM  Type of Therapy:  Psychoeducational Skills  Participation Level:  Active  Participation Quality:  Appropriate  Affect:  Appropriate  Cognitive:  Appropriate  Insight:  Good  Engagement in Group:  Engaged  Modes of Intervention:  Discussion  Summary of Progress/Problems: When asking about the patients day he stated that his day was "a 9 and had no problems."  He mentioned that he would use music as his coping skill once he left the hospital. Madaline Savageiamond N Prue Lingenfelter 03/12/2015, 8:20 PM

## 2015-03-12 NOTE — Progress Notes (Signed)
Jerry [name]  03/12/2015 1:40 PM Jerry [name]  MRN:  [id number]  Subjective: Jerry [name] says he is feeling a lot better today. He says he is more calm, calculated, able make his goals and still remember them. He says sleep is good. Denies any SIHI, AVH.Is participating in group milieu.  Principal Problem: Schizoaffective disorder, bipolar type  Diagnosis:   Patient Active Problem List   Diagnosis Date Noted  . Schizoaffective disorder, bipolar type [F25.0] 03/10/2015  . TOBACCO USER [Z72.0] 08/02/2009   Total Time spent with patient: 35 minutes  Past Medical History: History reviewed. No pertinent past medical history.  Past Surgical History  Procedure Laterality Date  . Leg surgery Right    Family History:  Family History  Problem Relation Age of Onset  . Arthritis Mother   . Heart Problems Father    Social History:  History  Alcohol Use  . Yes     History  Drug Use  . Yes  . Special: Marijuana    History   Social History  . Marital Status: Single    Spouse Name: N/A  . Number of Children: N/A  . Years of Education: N/A   Social History Main Topics  . Smoking status: Current Every Day Smoker -- 1.00 packs/day for 20 years    Types: Cigarettes  . Smokeless tobacco: Not on file  . Alcohol Use: Yes  . Drug Use: Yes    Special: Marijuana  . Sexual Activity: No   Other Topics Concern  . None   Social History Narrative   Additional History:    Sleep: Good  Appetite:  Good  Assessment: Jerry [name] is alert & oriented x 3. He is currently reporting improved mood. He is participating in group milieu. He denies any new issues or symptoms.  Musculoskeletal: Strength & Muscle Tone: within normal limits Gait & Station: normal Patient leans: N/A  Psychiatric Specialty Exam: Physical Exam  Review of Systems  Constitutional: Negative.   HENT: Negative.   Eyes: Negative.   Respiratory: Negative.   Cardiovascular: Negative.   Gastrointestinal:  Negative.   Genitourinary: Negative.   Skin: Negative.   Neurological: Negative.   Endo/Heme/Allergies: Negative.   Psychiatric/Behavioral: Positive for depression (Improving) and substance abuse (Tobacco abuser). Negative for suicidal ideas, hallucinations and memory loss. The patient has insomnia (Improving). The patient is not nervous/anxious.     Blood pressure 115/66, pulse 68, temperature 97.8 F (36.6 C), temperature source Oral, resp. rate 16, height 5\' 11"  (1.803 m), weight 83.462 kg (184 lb).Body mass index is 25.67 kg/(m^2).  General Appearance: Casual  Eye Contact::  Fair  Speech:  Clear and Coherent  Volume:  Normal  Mood:  "Improving"  Affect:  Appropriate  Thought Process:  Coherent and Goal Directed  Orientation:  Full (Time, Place, and Person)  Thought Content:  Denies any hallucinations  Suicidal Thoughts:  No  Homicidal Thoughts:  No  Memory:  Immediate;   Fair Recent;   Fair Remote;   Poor  Judgement:  Fair  Insight:  Fair  Psychomotor Activity:  Normal  Concentration:  Fair  Recall:  [name]  Fund of Knowledge:Fair  Language: Fair  Akathisia:  No  Handed:  Right  AIMS (if indicated):     Assets:  Desire for Improvement  ADL's:  Intact  Cognition: WNL  Sleep:  Number of Hours: 3.75     Current Medications: Current Facility-Administered Medications  Medication Dose Route Frequency Provider Last Rate Last Dose  .  ARIPiprazole (ABILIFY) tablet 5 mg  5 mg Oral Daily Earney Navy, NP   5 mg at 03/12/15 0826  . [START ON 03/28/2015] ARIPiprazole SUSR 400 mg  400 mg Intramuscular Q30 days Earney Navy, NP      . benztropine (COGENTIN) tablet 0.5 mg  0.5 mg Oral BID Earney Navy, NP   0.5 mg at 03/12/15 0826  . divalproex (DEPAKOTE ER) 24 hr tablet 500 mg  500 mg Oral QHS Saramma Eappen, MD   500 mg at 03/11/15 2125  . hydrOXYzine (ATARAX/VISTARIL) tablet 25 mg  25 mg Oral TID PRN Worthy Flank, NP   25 mg at 03/11/15 2334  . nicotine  (NICODERM CQ - dosed in mg/24 hours) patch 21 mg  21 mg Transdermal Daily Jomarie Longs, MD   21 mg at 03/12/15 0827  . OLANZapine zydis (ZYPREXA) disintegrating tablet 10 mg  10 mg Oral Q8H PRN Jomarie Longs, MD      . traZODone (DESYREL) tablet 100 mg  100 mg Oral QHS PRN Worthy Flank, NP   100 mg at 03/11/15 2333    Lab Results:  Results for orders placed or performed during the hospital encounter of 03/10/15 (from the past 48 hour(s))  Lipid panel     Status: None   Collection Time: 03/12/15  6:30 AM  Result Value Ref Range   Cholesterol 149 0 - 200 mg/dL   Triglycerides 74 <161 mg/dL   HDL 50 >09 mg/dL   Total CHOL/HDL Ratio 3.0 RATIO   VLDL 15 0 - 40 mg/dL   LDL Cholesterol 84 0 - 99 mg/dL    Comment:        Total Cholesterol/HDL:CHD Risk Coronary Heart Disease Risk Table                     Men   Women  1/2 Average Risk   3.4   3.3  Average Risk       5.0   4.4  2 X Average Risk   9.6   7.1  3 X Average Risk  23.4   11.0        Use the calculated Patient Ratio above and the CHD Risk Table to determine the patient's CHD Risk.        ATP III CLASSIFICATION (LDL):  <100     mg/dL   Optimal  604-540  mg/dL   Near or Above                    Optimal  130-159  mg/dL   Borderline  981-191  mg/dL   High  >478     mg/dL   Very High Performed at [facility]   TSH     Status: None   Collection Time: 03/12/15  6:30 AM  Result Value Ref Range   TSH 0.881 0.350 - 4.500 uIU/mL    Comment: Performed at [facility]  Gamma GT     Status: None   Collection Time: 03/12/15  6:30 AM  Result Value Ref Range   GGT 22 7 - 51 U/L    Comment: Performed at [facility]    Physical Findings: AIMS:  , ,  ,  ,    CIWA:    COWS:     Treatment Plan Summary: Daily contact with patient to assess and evaluate symptoms and progress in treatment and Medication management:  Daily contact with patient to  assess and evaluate symptoms and progress in  treatment Medication management Plan: Supportive approach, encourage use of coping skills.           Mood control: continue Abilify 5 mg & injectable, Benztropine 0.5 mg for EPS, Depakote ER 500 mg for mood stabilization, Hydroxyzine 25 mg for anxiety prn, Olanzapine Zydis  10 mg for mood control & Trazodone 100 mg for insomnia.          Medical Decision Making:  Established Problem, Stable/Improving (1), Review of Psycho-Social Stressors (1), Review of Medication Regimen & Side Effects (2) and Review of New Medication or Change in Dosage (2)  Sanjuana Kava, PMHNP-BC 03/12/2015, 1:40 PM I agree with assessment and plan Madie Reno A. Dub Mikes, M.D.

## 2015-03-12 NOTE — [facility] Group Notes (Signed)
[facility] Group Notes:  (Clinical Social Work)  03/12/2015  11:15-12:00PM  Summary of Progress/Problems:   The main focus of today's process group was to discuss patients' feelings about hospitalization, the stigma attached to mental health, and sources of motivation to stay well.  We then worked to identify a specific plan to avoid future hospitalizations when discharged from the hospital for this admission.  The patient expressed that the good part of being in the hospital is that he can learn new techniques to help him in his life.  The bad part, as he talked about it, was too disorganized in its presentation for CSW to understand.  Later in group, he became more coherent, and was very supportive of others, telling them he could understand what they were talking about because he has experienced similar things.  Type of Therapy:  Group Therapy - Process  Participation Level:  Active  Participation Quality:  Attentive, Sharing and Supportive  Affect:  Blunted  Cognitive:  Alert, Appropriate and Disorganized  Insight:  Developing/Improving  Engagement in Therapy:  Engaged  Modes of Intervention:  Exploration, Discussion  Ambrose MantleMareida Grossman-Orr, LCSW 03/12/2015, 4:47 PM

## 2015-03-12 NOTE — Progress Notes (Signed)
Patient ID: Jerry [name], male   DOB: 04-Dec-1985, 29 y.o.   MRN: [id number] D: Patient reports feeling well on his medication. Pt mood and affect appeared anxious but a lot brighter than yesterday. Pt c/o boredom this evening. Pt reports hoping to discharge on Monday. Pt pleasant and interacting with peers. Cooperative with assessment.  A: Met with pt 1:1. Medications administered as prescribed. Writer encouraged pt to discuss feelings. Pt given playing card and games.   R: Patient remains safe. He is complaint with medications and denies any adverse reaction. Pt denies SI/HI/AVH and pain. Pt attended evening wrap up group and engaged in discussion.

## 2015-03-12 NOTE — [facility] Group Notes (Signed)
[facility] Group Notes:  (Nursing/MHT/Case Management/Adjunct)  Date:  03/12/2015  Time:  10:17 AM  Type of Therapy:  Psychoeducational Skills  Participation Level:  Active  Participation Quality:  Appropriate  Affect:  Appropriate  Cognitive:  Alert  Insight:  Limited  Engagement in Group:  Engaged and Improving  Modes of Intervention:  Discussion and Education  Summary of Progress/Problems: Patient attended morning goals group. Patient was unsure of what his goal was this morning and said his goal was to come up with a goal. Patient then stated a long term goal of his is to find something that he can take care of himself that is obtainable yet challenging. Patient was given self inventory sheet which he completed and daily workbook.   Marzetta BoardDopson, Andrika Peraza E 03/12/2015, 10:17 AM

## 2015-03-12 NOTE — Plan of Care (Signed)
Problem: Aggression Towards others,Towards Self, and or Destruction Goal: STG-Patient will comply with prescribed medication regimen (Patient will comply with prescribed medication regimen)  Outcome: Progressing Pt compliant with prescribed medications this evening

## 2015-03-12 NOTE — Plan of Care (Signed)
Problem: Alteration in mood; excessive anxiety as evidenced by: Goal: STG-Pt can identify coping skills to manage panic/anxiety (Patient can identify at least ____ coping skills to manage panic/anxiety attack)  Outcome: Progressing Pt coloring to relives anxiety. Medication administered for anxiety

## 2015-03-12 NOTE — Plan of Care (Signed)
Problem: Aggression Towards others,Towards Self, and or Destruction Goal: STG-Patient will comply with prescribed medication regimen (Patient will comply with prescribed medication regimen)  Outcome: Progressing Patient currently following medication regimen

## 2015-03-12 NOTE — Progress Notes (Signed)
Patient ID: Jerry [name], male   DOB: 06/29/1986, 29 y.o.   MRN: [id number]  DAR: Pt. Denies SI/HI and A/V Hallucinations. Patient does not report any pain or discomfort at this time. Patient reports sleep was fair last night, appetite is fair, energy level normal, and concentration level is good. Patient rates his depression, anxiety, and hopelessness at 0/10. Support and encouragement provided to the patient. Scheduled medications administered to patient per physician's orders. Patient is receptive and cooperative with staff but somewhat minimal. Patient is seen in the milieu speaking with peers and is attending some groups. Q15 minute checks are maintained for safety.

## 2015-03-13 NOTE — Plan of Care (Signed)
Problem: Alteration in thought process Goal: STG-Patient does not respond to command hallucinations Outcome: Progressing Pt does not seem to be responding to internal stimuli. Pt denies A/V hallucination

## 2015-03-13 NOTE — Progress Notes (Signed)
Patient ID: Jerry [name], male   DOB: 10/03/1986, 28 y.o.   MRN: [id number] D: Patient reports feeling well on his medication. Pt mood and affect is appropriate is situation. Pt had hair cut this evening and reports feeling good about himself. Pt pleasant and interacting with peers. Cooperative with assessment.  A: Met with pt 1:1. Medications administered as prescribed. Writer encouraged pt to discuss feelings.    R: Patient remains safe. He is complaint with medications and denies any adverse reaction. Pt denies SI/HI/AVH and pain. Pt in his room writing lyrics to a song he is working on. Pt attended evening wrap up group and engaged in discussion.

## 2015-03-13 NOTE — [facility] Group Notes (Signed)
[facility] Group Notes:  (Clinical Social Work)  03/13/2015   11:15am-12:00pm  Summary of Progress/Problems:  The main focus of today's process group was to listen to a variety of genres of music and to identify that different types of music provoke different responses.  The patient then was able to identify personally what was soothing for them, as well as energizing.  Handouts were used to record feelings evoked, as well as how patient can personally use this knowledge in sleep habits, with depression, and with other symptoms.  The patient expressed understanding of concepts, as well as knowledge of how each type of music affected him/her and how this can be used at home as a wellness/recovery tool.  He sang and danced, and seemed to really enjoy the group, said he can use this at home.  Type of Therapy:  Music Therapy   Participation Level:  Active  Participation Quality:  Attentive and Sharing  Affect:  Appropriate  Cognitive:  Disorganized  Insight:  Engaged  Engagement in Therapy:  Engaged  Modes of Intervention:   Activity, Exploration  Ambrose MantleMareida Grossman-Orr, LCSW 03/13/2015, 12:30pm

## 2015-03-13 NOTE — Progress Notes (Signed)
Patient ID: Jerry [name], male   DOB: 01/18/1986, 29 y.o.   MRN: [id number] [facility] MD Progress Note  03/13/2015 2:12 PM Jerry [name]  MRN:  [id number]  Subjective: Mr. Jerry [name] says all is well today. Mood is improving, feeling a lot better. Participating in group milieu. Jerry [name] currently denies any new issues and or concerns. Denies SIHI, AVH, delusional thoughts and or paranoia.  Principal Problem: Schizoaffective disorder, bipolar type  Diagnosis:   Patient Active Problem List   Diagnosis Date Noted  . Schizoaffective disorder, bipolar type [F25.0] 03/10/2015  . TOBACCO USER [Z72.0] 08/02/2009   Total Time spent with patient: 35 minutes  Past Medical History: History reviewed. No pertinent past medical history.  Past Surgical History  Procedure Laterality Date  . Leg surgery Right    Family History:  Family History  Problem Relation Age of Onset  . Arthritis Mother   . Heart Problems Father    Social History:  History  Alcohol Use  . Yes     History  Drug Use  . Yes  . Special: Marijuana    History   Social History  . Marital Status: Single    Spouse Name: N/A  . Number of Children: N/A  . Years of Education: N/A   Social History Main Topics  . Smoking status: Current Every Day Smoker -- 1.00 packs/day for 20 years    Types: Cigarettes  . Smokeless tobacco: Not on file  . Alcohol Use: Yes  . Drug Use: Yes    Special: Marijuana  . Sexual Activity: No   Other Topics Concern  . None   Social History Narrative   Additional History:    Sleep: Good  Appetite:  Good  Assessment: Jerry [name] is alert & oriented x 3. He is currently reporting improved mood. He is participating in group milieu. He denies any new issues or symptoms.  Musculoskeletal: Strength & Muscle Tone: within normal limits Gait & Station: normal Patient leans: N/A  Psychiatric Specialty Exam: Physical Exam  ROS  Blood pressure 100/69, pulse 83, temperature 98.3 F (36.8 C),  temperature source Oral, resp. rate 16, height 5\' 11"  (1.803 m), weight 83.462 kg (184 lb).Body mass index is 25.67 kg/(m^2).  General Appearance: Casual  Eye Contact::  Fair  Speech:  Clear and Coherent  Volume:  Normal  Mood:  "Improving"  Affect:  Appropriate  Thought Process:  Coherent and Goal Directed  Orientation:  Full (Time, Place, and Person)  Thought Content:  Denies any hallucinations  Suicidal Thoughts:  No  Homicidal Thoughts:  No  Memory:  Immediate;   Fair Recent;   Fair Remote;   Poor  Judgement:  Fair  Insight:  Fair  Psychomotor Activity:  Normal  Concentration:  Fair  Recall:  Jerry [name]  Fund of Knowledge:Fair  Language: Fair  Akathisia:  No  Handed:  Right  AIMS (if indicated):     Assets:  Desire for Improvement  ADL's:  Intact  Cognition: WNL  Sleep:  Number of Hours: 6.25   Current Medications: Current Facility-Administered Medications  Medication Dose Route Frequency Provider Last Rate Last Dose  . ARIPiprazole (ABILIFY) tablet 5 mg  5 mg Oral Daily Earney NavyJosephine C Onuoha, NP   5 mg at 03/13/15 0858  . [START ON 03/28/2015] ARIPiprazole SUSR 400 mg  400 mg Intramuscular Q30 days Earney NavyJosephine C Onuoha, NP      . benztropine (COGENTIN) tablet 0.5 mg  0.5 mg Oral BID Earney NavyJosephine C Onuoha, NP  0.5 mg at 03/13/15 0858  . divalproex (DEPAKOTE ER) 24 hr tablet 500 mg  500 mg Oral QHS Jerry Eappen, MD   500 mg at 03/12/15 2155  . hydrOXYzine (ATARAX/VISTARIL) tablet 25 mg  25 mg Oral TID PRN Worthy Flank, NP   25 mg at 03/12/15 2154  . nicotine (NICODERM CQ - dosed in mg/24 hours) patch 21 mg  21 mg Transdermal Daily Jerry Eappen, MD   21 mg at 03/13/15 0900  . OLANZapine zydis (ZYPREXA) disintegrating tablet 10 mg  10 mg Oral Q8H PRN Jomarie Longs, MD      . traZODone (DESYREL) tablet 100 mg  100 mg Oral QHS PRN Worthy Flank, NP   100 mg at 03/12/15 2155   Lab Results:  Results for orders placed or performed during the hospital encounter of 03/10/15 (from the  past 48 hour(s))  Vitamin B12     Status: None   Collection Time: 03/12/15  6:30 AM  Result Value Ref Range   Vitamin B-12 519 211 - 911 pg/mL    Comment: Performed at [facility]  Lipid panel     Status: None   Collection Time: 03/12/15  6:30 AM  Result Value Ref Range   Cholesterol 149 0 - 200 mg/dL   Triglycerides 74 <161 mg/dL   HDL 50 >09 mg/dL   Total CHOL/HDL Ratio 3.0 RATIO   VLDL 15 0 - 40 mg/dL   LDL Cholesterol 84 0 - 99 mg/dL    Comment:        Total Cholesterol/HDL:CHD Risk Coronary Heart Disease Risk Table                     Men   Women  1/2 Average Risk   3.4   3.3  Average Risk       5.0   4.4  2 X Average Risk   9.6   7.1  3 X Average Risk  23.4   11.0        Use the calculated Patient Ratio above and the CHD Risk Table to determine the patient's CHD Risk.        ATP III CLASSIFICATION (LDL):  <100     mg/dL   Optimal  604-540  mg/dL   Near or Above                    Optimal  130-159  mg/dL   Borderline  981-191  mg/dL   High  >478     mg/dL   Very High Performed at [facility]   TSH     Status: None   Collection Time: 03/12/15  6:30 AM  Result Value Ref Range   TSH 0.881 0.350 - 4.500 uIU/mL    Comment: Performed at [facility]  Gamma GT     Status: None   Collection Time: 03/12/15  6:30 AM  Result Value Ref Range   GGT 22 7 - 51 U/L    Comment: Performed at [facility]   Physical Findings: AIMS:  , ,  ,  ,    CIWA:    COWS:     Treatment Plan Summary: Daily contact with patient to assess and evaluate symptoms and progress in treatment and Medication management:  Daily contact with patient to assess and evaluate symptoms and progress in treatment Medication management Plan: Supportive approach, encourage use of coping skills.  Mood control: continue Abilify 5  mg & injectable, Benztropine 0.5 mg for EPS, Depakote ER 500 mg for mood stabilization, Hydroxyzine 25 mg for anxiety prn, Olanzapine Zydis  10 mg for mood control & Trazodone 100 mg for insomnia.          Medical Decision Making:  Established Problem, Stable/Improving (1), Review of Psycho-Social Stressors (1), Review of Medication Regimen & Side Effects (2) and Review of New Medication or Change in Dosage (2)  Sanjuana Kava, PMHNP-BC 03/13/2015, 2:12 PM

## 2015-03-13 NOTE — Progress Notes (Signed)
[facility] Group Notes:  (Nursing/MHT/Case Management/Adjunct)  Date:  03/13/2015  Time:  9:21 PM  Type of Therapy:  Psychoeducational Skills  Participation Level:  Active  Participation Quality:  Attentive  Affect:  Flat  Cognitive:  Appropriate  Insight:  Improving  Engagement in Group:  Improving  Modes of Intervention:  Education  Summary of Progress/Problems: Patient shared in group this evening that he had a good day for a number of reasons. First of all, he stated that he smiled more than yesterday. Secondly, he verbalized that he was "humble" with his medication in terms of feeling "leveled out" and stable. Finally, he mentioned over and over again that he was grateful for being in the hospital and for the help that he has received. As a theme for the day, his support system will consist of his family, however, he is unclear as to whether or not they will be critical of him upon returning home.   [name], Jerry [name] 03/13/2015, 9:21 PM

## 2015-03-13 NOTE — Progress Notes (Signed)
Patient ID: Jerry [name], male   DOB: 11/06/1986, 29 y.o.   MRN: [id number]  DAR: Pt. Denies SI/HI and A/V Hallucinations. Patient reports sleep is good, appetite is good, energy level is normal, and concentration level is good. PAtient rates his depression, anxiety, and hopelessness 0/10. Patient reports he is feeling like he is ready to discharge soon. Patient does not report any pain or discomfort at this time. Support and encouragement provided to the patient. Scheduled medications administered to patient per physician's orders. No PRN medications needed at this time. Patient is receptive and cooperative. Patient is seen in the milieu and is attending some groups. Patient continues to be flirtatious with both male staff and peers but is easily redirectable. Q15 minute checks are maintained for safety.

## 2015-03-13 NOTE — Plan of Care (Signed)
Problem: Diagnosis: Increased Risk For Suicide Attempt Goal: STG-Patient Will Comply With Medication Regime Outcome: Progressing Patient is compliant but does remain minimally suspicious.

## 2015-03-13 NOTE — [facility] Group Notes (Signed)
[facility] Group Notes:  (Nursing/MHT/Case Management/Adjunct)  Date:  03/13/2015  Time:  11:22 AM  Type of Therapy:  Music Therapy  Participation Level:  Active  Participation Quality:  Appropriate, Attentive, Sharing and Supportive  Affect:  Appropriate  Cognitive:  Alert  Insight:  Improving  Engagement in Group:  Engaged and Supportive  Modes of Intervention:  Activity  Summary of Progress/Problems: Patient attended group and was fully involved. Patient showed staff and other patients exercises he does. 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 Patient was smiling during group and having fun listening to the music.  Aryahna Spagna E 03/13/2015, 11:22 AM

## 2015-03-14 LAB — HEMOGLOBIN A1C
HEMOGLOBIN A1C: 4.7 % — AB (ref 4.8–5.6)
Mean Plasma Glucose: 88 mg/dL

## 2015-03-14 MED ORDER — ARIPIPRAZOLE 15 MG PO TABS
7.5000 mg | ORAL_TABLET | Freq: Every day | ORAL | Status: DC
  Administered 2015-03-15: 7.5 mg via ORAL
  Filled 2015-03-14 (×2): qty 1
  Filled 2015-03-14: qty 2

## 2015-03-14 MED ORDER — BENZTROPINE MESYLATE 0.5 MG PO TABS
0.5000 mg | ORAL_TABLET | Freq: Every day | ORAL | Status: DC
  Administered 2015-03-15: 0.5 mg via ORAL
  Filled 2015-03-14: qty 1
  Filled 2015-03-14: qty 14
  Filled 2015-03-14: qty 1

## 2015-03-14 NOTE — Clinical Social Work Note (Signed)
CSW met with patient to assess discharge needs.  Patient focused on medication.  He stated he is okay being on Abilify but wants to take it in pill form.  He stated he does not want to get shots.  He stated he has a good mind and knows what he is talking about.  Patient advised he will need to talk with MD an Nurse about medications as CSW is not able to prescribe meds.  Patient asked to let CSW know if he needs help with follow up.

## 2015-03-14 NOTE — [facility] Group Notes (Signed)
[facility]   03/14/2015 10:32 AM  Participation Quality:  Active, engaged  Mood/Affect:  Appropriate  Depression Rating:  0  Anxiety Rating:  0  Thoughts of Suicide:  No Will you contract for safety?   NA  Current AVH:  No  Plan for Discharge/Comments:  Will return home to live w parents, feels Abilify has helped w his "attitude" - wants to withdraw from injectables because they make his muscles sore.  Wants to have physical job, says he will take orals.  [facility].    Transportation Means: parents or bus pass  Supports: Family support in place  Jerry [name], Jerry [name]  03/14/2015 10:34 AM

## 2015-03-14 NOTE — Progress Notes (Signed)
D: Pt complaining of agitation. Pt stated that he is mad, he wants to leave, and he feels that he is being held hostage. "Ya'll haven't seen my frustration yet, but you will".  Pt denies SI/HI/AVH but is seen in the hallways responding to internal stimuli.  A: Support given. Verbalization encouraged. Pt encouraged to come to staff for any concerns. PRN medication given as prescribed for agitation.  R: Pt is receptive. No complaints of pain or discomfort at this time. Q15 min safety checks maintained. Pt remains safe on the unit. Will continue to monitor.

## 2015-03-14 NOTE — Progress Notes (Signed)
Patient ID: Jerry [name], male   DOB: 06/05/1986, 29 y.o.   MRN: [id number] [facility]  03/14/2015 11:27 AM Jerry [name]  MRN:  [id number]  Subjective: Jerry [name] says " I am better . I feel my mood is improving , I will not hurt anyone unless they mess with me."  Objective:Jerry [name] is an 29 y.o. AA male, who is single , employed -does odd jobs , lives [place] mother in [place] , presented to [facility] under [facility]. Per initial notes in EHR , Pt was reportedly aggressive [facility] when he went there to try and get back on his psych medications. Pt presented with disorganized thought pattern and tangential speech as well as was delusional and hyper sexual on presentation. Pt today denies AH/ paranoia , but is seen as talking to self in the hallways , and smiling inappropriately at times . Pt with less mood swings , is less anxious than on admission. Pt states he received his Abilify maintenna IM right before coming to hospital , then says may be not " I got it the first of this month " , pt appears to not be able to recollect when he got it last . Discussed the benefit of being on an LAI due to his hx of noncompliance which resulted in his recent admission. Pt to consider LAI prior to discharge. Pt is on depakote , which seems to be effective in helping with his mood . Depakote level tomorrow AM. Pt encouraged to take his medications . LCSW will work on getting his medication list from [facility] , so that he can receive his LAI prior to discharge . Pt denies ADRs of medications . Pt to attend groups .    Principal Problem: Schizoaffective disorder, bipolar type  Diagnosis:   Patient Active Problem List   Diagnosis Date Noted  . Schizoaffective disorder, bipolar type [F25.0] 03/10/2015  . TOBACCO USER [Z72.0] 08/02/2009   Total Time spent with patient: 30 minutes  Past Medical History: History reviewed. No pertinent past medical history.  Past Surgical History   Procedure Laterality Date  . Leg surgery Right    Family History:  Family History  Problem Relation Age of Onset  . Arthritis Mother   . Heart Problems Father    Social History:  History  Alcohol Use  . Yes     History  Drug Use  . Yes  . Special: Marijuana    History   Social History  . Marital Status: Single    Spouse Name: N/A  . Number of Children: N/A  . Years of Education: N/A   Social History Main Topics  . Smoking status: Current Every Day Smoker -- 1.00 packs/day for 20 years    Types: Cigarettes  . Smokeless tobacco: Not on file  . Alcohol Use: Yes  . Drug Use: Yes    Special: Marijuana  . Sexual Activity: No   Other Topics Concern  . None   Social History Narrative   Additional History:    Sleep: Good  Appetite:  Good    Musculoskeletal: Strength & Muscle Tone: within normal limits Gait & Station: normal Patient leans: N/A  Psychiatric Specialty Exam: Physical Exam  ROS  Blood pressure 117/68, pulse 68, temperature 97.8 F (36.6 C), temperature source Oral, resp. rate 18, height 5\' 11"  (1.803 m), weight 83.462 kg (184 lb).Body mass index is 25.67 kg/(m^2).  General Appearance: Casual  Eye Contact::  Fair  Speech:  Clear and Coherent  Volume:  Normal  Mood:  "Improving"  Affect:  Appropriate  Thought Process:  Coherent and Goal Directed  Orientation:  Full (Time, Place, and Person)  Thought Content:  Denies any hallucinations  Suicidal Thoughts:  No  Homicidal Thoughts:  No  Memory:  Immediate;   Fair Recent;   Fair Remote;   Poor  Judgement:  Fair  Insight:  Fair  Psychomotor Activity:  Normal  Concentration:  Fair  Recall:  [employer] of Knowledge:Fair  Language: Fair  Akathisia:  No  Handed:  Right  AIMS (if indicated):     Assets:  Desire for Improvement  ADL's:  Intact  Cognition: WNL  Sleep:  Number of Hours: 6   Current Medications: Current Facility-Administered Medications  Medication Dose Route Frequency  Provider Last Rate Last Dose  . [START ON 03/15/2015] ARIPiprazole (ABILIFY) tablet 7.5 mg  7.5 mg Oral Daily Jomarie Longs, MD      . Melene Muller ON 03/28/2015] ARIPiprazole SUSR 400 mg  400 mg Intramuscular Q30 days Earney Navy, NP      . Melene Muller ON 03/15/2015] benztropine (COGENTIN) tablet 0.5 mg  0.5 mg Oral Daily Tricha Ruggirello, MD      . divalproex (DEPAKOTE ER) 24 hr tablet 500 mg  500 mg Oral QHS Ekta Dancer, MD   500 mg at 03/13/15 2123  . hydrOXYzine (ATARAX/VISTARIL) tablet 25 mg  25 mg Oral TID PRN Worthy Flank, NP   25 mg at 03/13/15 2123  . nicotine (NICODERM CQ - dosed in mg/24 hours) patch 21 mg  21 mg Transdermal Daily Jomarie Longs, MD   21 mg at 03/14/15 0744  . OLANZapine zydis (ZYPREXA) disintegrating tablet 10 mg  10 mg Oral Q8H PRN Jomarie Longs, MD      . traZODone (DESYREL) tablet 100 mg  100 mg Oral QHS PRN Worthy Flank, NP   100 mg at 03/13/15 2123   Lab Results:  No results found for this or any previous visit (from the past 48 hour(s)). Physical Findings: AIMS:  , ,  ,  ,    CIWA:    COWS:     Assessment: Jerry [name] is an AAM who presented very disorganized , delusional , with labile moods , aggressive and hypersexual. Pt currently is improving on his medication regimen. Pt has hx of noncompliance and will benefit from a LAI . Pt to consider this prior to DC. LCSW to obtain information from [facility] regarding his last dose of Abilify maintenna , since pt is not able to recollect when he received it last. Pt also to have Depakote level done tomorrow .   Treatment Plan Summary: Daily contact with patient to assess and evaluate symptoms and progress in treatment and Medication management:  Daily contact with patient to assess and evaluate symptoms and progress in treatment Medication management Plan: Supportive approach, encourage use of coping skills.  Mood control: Increase Abilify to 7.5 mg po daily  , Benztropine 0.5 mg for EPS po daily , Depakote ER 500  mg po qhs for mood stabilization, Hydroxyzine 25 mg for anxiety prn, Olanzapine Zydis 10 mg for mood control & Trazodone 100 mg for insomnia. Depakote level tomorrow AM. Possible administration of LAI Abilify maintenna IM 400 mg prior to DC, LCSW will obtain information from [place]. Pt to participate in groups . CSW will work on disposition.            Medical Decision Making:  Review of Psycho-Social Stressors (1), Review or order clinical lab tests (1), Review of Last Therapy Session (1), Review of Medication Regimen & Side Effects (2) and Review of New Medication or Change in Dosage (2)  Tanequa Kretz, MD 03/14/2015, 11:27 AM

## 2015-03-14 NOTE — [facility] Group Notes (Signed)
[facility]  Date/Time: 03/14/2015 4:16 PM   Type of Therapy and Topic:  Group Therapy:  Overcoming Obstacles  Participation Level:  Invited, did not attend  Description of Group:    In this group patients will be encouraged to explore what they see as obstacles to their own wellness and recovery. They will be guided to discuss their thoughts, feelings, and behaviors related to these obstacles. The group will process together ways to cope with barriers, with attention given to specific choices patients can make. Each patient will be challenged to identify changes they are motivated to make in order to overcome their obstacles. This group will be process-oriented, with patients participating in exploration of their own experiences as well as giving and receiving support and challenge from other group members.  Therapeutic Goals: 1. Patient will identify personal and current obstacles as they relate to admission. 2. Patient will identify barriers that currently interfere with their wellness or overcoming obstacles.  3. Patient will identify feelings, thought process and behaviors related to these barriers. 4. Patient will identify two changes they are willing to make to overcome these obstacles:    Summary of Patient Progress   NA   Therapeutic Modalities:   Cognitive Behavioral Therapy Solution Focused Therapy Motivational Interviewing Relapse Prevention Therapy  Santa GeneraAnne Melba Araki, LCSW Clinical Social Worker

## 2015-03-15 LAB — VALPROIC ACID LEVEL: Valproic Acid Lvl: 29.1 ug/mL — ABNORMAL LOW (ref 50.0–100.0)

## 2015-03-15 MED ORDER — DIVALPROEX SODIUM ER 500 MG PO TB24: 500.0000 mg | ORAL_TABLET | Freq: Every day | ORAL | Status: DC

## 2015-03-15 MED ORDER — ARIPIPRAZOLE ER 400 MG IM SUSR: 400.0000 mg | INTRAMUSCULAR | Status: DC

## 2015-03-15 MED ORDER — NON FORMULARY: 400.0000 mg | Status: DC

## 2015-03-15 MED ORDER — TRAZODONE HCL 100 MG PO TABS: 100.0000 mg | ORAL_TABLET | Freq: Every evening | ORAL | Status: DC | PRN

## 2015-03-15 MED ORDER — NICOTINE 21 MG/24HR TD PT24: 21.0000 mg | MEDICATED_PATCH | Freq: Every day | TRANSDERMAL | Status: DC

## 2015-03-15 MED ORDER — ARIPIPRAZOLE ER 400 MG IM SUSR
400.0000 mg | INTRAMUSCULAR | Status: DC
  Administered 2015-03-15: 400 mg via INTRAMUSCULAR
  Filled 2015-03-15: qty 400

## 2015-03-15 MED ORDER — BENZTROPINE MESYLATE 0.5 MG PO TABS: 0.5000 mg | ORAL_TABLET | Freq: Every day | ORAL | Status: DC

## 2015-03-15 MED ORDER — ARIPIPRAZOLE 15 MG PO TABS: 7.5000 mg | ORAL_TABLET | Freq: Every day | ORAL | Status: DC

## 2015-03-15 NOTE — Progress Notes (Signed)
Patient ID: Jerry [name], male   DOB: 08/26/1986, 29 y.o.   MRN: [id number] D: Patient to be discharged home today.  He denies SI/HI/AVH.  He is sleeping and eating well.  He denies any physical pain.  Patient is currently awaiting his ability injection.  He plans to leave today by 1400.  His mother will be picking him up. A: Continue to monitor medication management and MD orders.  Safety checks completed every 15 minutes per protocol.  Meet 1;1 with patient to discuss concerns and offer encouragement. R: Patient's behavior is appropriate to situation.

## 2015-03-15 NOTE — [facility] Suicide Risk Assessment (Signed)
[facility] INPATIENT:  Family/Significant Other Suicide Prevention Education  Suicide Prevention Education:  Education Completed; Georga HackingSharon Milton, Mother, [phone];  has been identified by the patient as the family member/significant other with whom the patient will be residing, and identified as the person(s) who will aid the patient in the event of a mental health crisis (suicidal ideations/suicide attempt).  With written consent from the patient, the family member/significant other has been provided the following suicide prevention education, prior to the and/or following the discharge of the patient.  The suicide prevention education provided includes the following:  Suicide risk factors  Suicide prevention and interventions  National Suicide Hotline telephone number  [facility] assessment telephone number  [facility] Emergency Assistance 911  [facility] and/or Residential Mobile Crisis Unit telephone number  Request made of family/significant other to:  Remove weapons (e.g., guns, rifles, knives), all items previously/currently identified as safety concern.  Mother advised patient does not have access to guns.  Remove drugs/medications (over-the-counter, prescriptions, illicit drugs), all items previously/currently identified as a safety concern.  The family member/significant other verbalizes understanding of the suicide prevention education information provided.  The family member/significant other agrees to remove the items of safety concern listed above.  Wynn BankerHodnett, Muhannad Bignell Hairston 03/15/2015, 12:41 PM

## 2015-03-15 NOTE — Discharge Summary (Signed)
Physician Discharge Summary Note  Patient:  Jerry [name] is an 29 y.o., male MRN:  [id number] DOB:  1986/08/20 Patient phone:  [phone] (home)  Patient address:   [address] [place] [address],  Total Time spent with patient: 30 minutes  Date of Admission:  03/10/2015 Date of Discharge: 03/15/15  Reason for Admission:  Mood stabilization treatments  Principal Problem: Schizoaffective disorder, bipolar type Discharge Diagnoses: Patient Active Problem List   Diagnosis Date Noted  . Schizoaffective disorder, bipolar type [F25.0] 03/10/2015  . TOBACCO USER [Z72.0] 08/02/2009    Musculoskeletal: Strength & Muscle Tone: within normal limits Gait & Station: normal Patient leans: N/A  Psychiatric Specialty Exam: Physical Exam  Psychiatric: He has a normal mood and affect. His speech is normal and behavior is normal. Judgment and thought content normal. Cognition and memory are normal.    Review of Systems  Constitutional: Negative.   HENT: Negative.   Eyes: Negative.   Respiratory: Negative.   Cardiovascular: Negative.   Gastrointestinal: Negative.   Genitourinary: Negative.   Musculoskeletal: Negative.   Skin: Negative.   Neurological: Negative.   Endo/Heme/Allergies: Negative.   Psychiatric/Behavioral: Positive for hallucinations (Stabilized with treatments). Negative for depression, suicidal ideas, memory loss and substance abuse. The patient is not nervous/anxious and does not have insomnia.     Blood pressure 98/76, pulse 65, temperature 98.2 F (36.8 C), temperature source Oral, resp. rate 18, height  (1.803 m), weight 83.462 kg (184 lb).Body mass index is 25.67 kg/(m^2).  See Physician SRA     Past Medical History: History reviewed. No pertinent past medical history.  Past Surgical History  Procedure Laterality Date  . Leg surgery Right    Family History:  Family History  Problem Relation Age of Onset  . Arthritis Mother   . Heart  Problems Father    Social History:  History  Alcohol Use  . Yes     History  Drug Use  . Yes  . Special: Marijuana    History   Social History  . Marital Status: Single    Spouse Name: N/A  . Number of Children: N/A  . Years of Education: N/A   Social History Main Topics  . Smoking status: Current Every Day Smoker -- 1.00 packs/day for 20 years    Types: Cigarettes  . Smokeless tobacco: Not on file  . Alcohol Use: Yes  . Drug Use: Yes    Special: Marijuana  . Sexual Activity: No   Other Topics Concern  . None   Social History Narrative   Risk to Self: Is patient at risk for suicide?: No What has been your use of drugs/alcohol within the last 12 months?: Pt reports occasional marijuana use.  Risk to Others:   Prior Inpatient Therapy:   Prior Outpatient Therapy:    Level of Care:  OP  Hospital Course:    Jerry [name] is an 29 y.o. AA male, who is single , employed -does odd jobs , lives with his mother in [place] , presented to [facility] under IVC from [facility]. Per initial notes in EHR , Pt was reportedly aggressive at [facility] earlier tonight when he went there to try and get back on his psych medications. Pt presented with disorganized thought pattern and tangential speech. Pt is very delusional, exhibiting grandiose delusions and hyper-religiosity as well as paranoid ideations. He accuses people of stealing items from his room repeatedly. Pt states that he has been off of his Abilify  for 5-6 months and that everything "goes downhill" when he gets off of his medication.         Jerry [name] was admitted to the adult unit. He was evaluated and his symptoms were identified. Medication management was discussed and initiated. Patient was restarted on oral Abilify to address his psychotic symptoms.  Patient was started on Abilify Sustenna 400 mg prior to discharge to improve compliance and reduce repeat hospitalizations. He was also started on Depakote ER 500 mg at bedtime  to help with mood stabilization. He was oriented to the unit and encouraged to participate in unit programming. Medical problems were identified and treated appropriately. Home medication was restarted as needed.        The patient was evaluated each day by a clinical provider to ascertain the patient's response to treatment.  Improvement was noted by the patient's report of decreasing symptoms, improved sleep and appetite, affect, medication tolerance, behavior, and participation in unit programming.  He was asked each day to complete a self inventory noting mood, mental status, pain, new symptoms, anxiety and concerns. At times he showed poor insight into his mental illness. The patient wanted to keep taking oral medications and not get any "injections." However, his treatment team members worked with him to see the correlation of being off medications and worsening of his symptoms. The patient was observed in the hallways to be responding to internal stimulation at times but this improved as his treatment progressed. He is documented as having one episode where he became agitated after not being able to leave the hospital. Patient received occasional doses of Zyprexa Zydis 10 mg as needed for severe agitation.          He responded well to medication and being in a therapeutic and supportive environment. Positive and appropriate behavior was noted and the patient was motivated for recovery.  The patient worked closely with the treatment team and case manager to develop a discharge plan with appropriate goals. Coping skills, problem solving as well as relaxation therapies were also part of the unit programming.         By the day of discharge he was in much improved condition than upon admission.  Symptoms were reported as significantly decreased or resolved completely. The patient denied SI/HI and voiced no AVH. He was motivated to continue taking medication with a goal of continued improvement in mental  health.   Jerry [name] was discharged home with a plan to follow up as noted below. The patient was provided with sample medications and prescriptions at time of discharge. He left [facility] in stable condition with all belongings returned to him.    Consults:  psychiatry  Significant Diagnostic Studies:  UDS negative, Lipid profile, Vitamin B-12 level, Chemistry panel, CBC, Hemoglobin A1C, TSH  Discharge Vitals:   Blood pressure 98/76, pulse 65, temperature 98.2 F (36.8 C), temperature source Oral, resp. rate 18, height  (1.803 m), weight 83.462 kg (184 lb). Body mass index is 25.67 kg/(m^2). Lab Results:   Results for orders placed or performed during the hospital encounter of 03/10/15 (from the past 72 hour(s))  Valproic acid level     Status: Abnormal   Collection Time: 03/15/15  6:10 AM  Result Value Ref Range   Valproic Acid Lvl 29.1 (L) 50.0 - 100.0 ug/mL    Comment: Performed at [facility]    Physical Findings: AIMS: Facial and Oral Movements Muscles of Facial Expression: None, normal Lips and  Perioral Area: None, normal Jaw: None, normal Tongue: None, normal,Extremity Movements Upper (arms, wrists, hands, fingers): None, normal Lower (legs, knees, ankles, toes): None, normal, Trunk Movements Neck, shoulders, hips: None, normal, Overall Severity Severity of abnormal movements (highest score from questions above): None, normal Incapacitation due to abnormal movements: None, normal Patient's awareness of abnormal movements (rate only patient's report): No Awareness,    CIWA:    COWS:      See Psychiatric Specialty Exam and Suicide Risk Assessment completed by Attending Physician prior to discharge.  Discharge destination:  Home  Is patient on multiple antipsychotic therapies at discharge:  No   Has Patient had three or more failed trials of antipsychotic monotherapy by history:  No  Recommended Plan for Multiple Antipsychotic Therapies: NA      Medication List    TAKE these medications      Indication   ARIPiprazole 15 MG tablet  Commonly known as:  ABILIFY  Take 0.5 tablets (7.5 mg total) by mouth daily.   Indication:  Schizoaffective Disorder, Bipolar Type     ARIPiprazole 400 MG Susr  Inject 400 mg into the muscle every 30 (thirty) days. First dose received on March 15, 2015, next dose due in thirty days on Apr 14, 2015  Start taking on:  04/14/2015   Indication:  Schizoaffective Disorder, Bipolar Type     benztropine 0.5 MG tablet  Commonly known as:  COGENTIN  Take 1 tablet (0.5 mg total) by mouth daily.   Indication:  Extrapyramidal Reaction caused by Medications     divalproex 500 MG 24 hr tablet  Commonly known as:  DEPAKOTE ER  Take 1 tablet (500 mg total) by mouth at bedtime.   Indication:  Manic Phase of Manic-Depression     nicotine 21 mg/24hr patch  Commonly known as:  NICODERM CQ - dosed in mg/24 hours  Place 1 patch (21 mg total) onto the skin daily.   Indication:  Nicotine Addiction     traZODone 100 MG tablet  Commonly known as:  DESYREL  Take 1 tablet (100 mg total) by mouth at bedtime as needed for sleep (May repeat X1).   Indication:  Trouble Sleeping           Follow-up Information    Follow up with [facility]. Go on 03/18/2015.   Why:  Please go to the walk-in clinic Friday, March 18, 2015 or any weekday between 8:00am and 3:00pm for medication management. Take all of your hospital paperwork.     Contact information:   [address] [place]  [id number]  [phone]      Follow-up recommendations:   Activity: No restrictions Diet: pt to have regular diet Tests: as needed Other: abilify mainatenna IM 400 mg q 28 days , first dose today 03/15/2015  Comments:   Take all your medications as prescribed by your mental healthcare provider.  Report any adverse effects and or reactions from your medicines to your outpatient provider promptly.  Patient is instructed and cautioned  to not engage in alcohol and or illegal drug use while on prescription medicines.  In the event of worsening symptoms, patient is instructed to call the crisis hotline, 911 and or go to the nearest ED for appropriate evaluation and treatment of symptoms.  Follow-up with your primary care provider for your other medical issues, concerns and or health care needs.   Total Discharge Time: Greater than 30 minutes  Signed: Stephania Macfarlane NP-C 03/15/2015, 11:08 AM

## 2015-03-15 NOTE — Tx Team (Addendum)
Interdisciplinary Treatment Plan Update (Adult)  Date: 03/15/2015 Time Reviewed: 9:50 AM  Progress in Treatment:  Attending groups: Yes  Participating in groups: Yes  Taking medication as prescribed: Yes  Tolerating medication: Yes  Family/Significant other contact made: No, not yet  Patient understands diagnosis: No, limited insight  Discussing patient identified problems/goals with staff: Yes  Medical problems stabilized or resolved: Yes  Denies suicidal/homicidal ideation: Yes  Patient has not harmed self or Others: Yes  New problem(s) identified: NA Discharge Plan or Barriers: Pt plans to return home and receive outpatient services.   Additional comments:Jerry [name] is an 29 y.o. AA male, who is single , employed -does odd jobs , [place] , [facility]. Per initial notes in EHR , Pt [facility] tonight when he went there to try and get back on his psych medications. Pt presented with disorganized thought pattern and tangential speech. Pt is very delusional, exhibiting grandiose delusions and hyper-religiosity as well as paranoid ideations. He accuses people of stealing items from his room repeatedly. Pt states that he has been off of his Abilify for 5-6 months and that everything "goes downhill" when he gets off of his medication. Patient appeared to be very disorganized . Pt states that he is 'sexually depressed " and that he has not had sex in a long time . Pt states that all he did was go to [facility] to get back on his medication the "abilify maintenna IM " and he ended up in the ED. Pt however is not able to elaborate on the events more than that. Pt states that he does have anxiety and sometimes he feels as though he is "bipolar". Pt does hear voices - states that he hears them everyday - "when I pray in the morning". Pt states that the voices tell him only positive things . Pt denies SI/HI/VH/HI. Pt  however appeared to be having an inappropriate affect as well as appeared to be disorganized during the evaluation.Pt reports abusing cannabis - when asked about the amount that he abuses - he states "enough" .  [facility] and [facility] in 2004.   03/15/15:   Is patient of [facility], receiving Abilify by injection.  [facility], patient has been inconsistent in getting injections, CSW requested records and will communicate w MD re dosage.  Patient will have TCT at discharge, can assist w increasing medication adherance.  Discharge planned for today.  Will return home to live w parents.  Pt and CSW Intern reviewed pt's identified goals and treatment plan. Pt verbalized understanding and agreed to treatment plan  Reason for Continuation of Hospitalization:  Medication stabilization  Delusions Hallucinations     Estimated length of stay: 4-5 days   Attendees:  Patient:  03/15/2015 9:50 AM   Family:  4/26/20169:50 AM   Physician: Dr. Elna BreslowEappen MD  4/26/20169:50 AM  Nursing: Blane OharaCaroline, RN  03/15/2015 9:50 AM  Clinical Social Worker: Santa GeneraAnne Orlandus Borowski, LCSW  4/26/20169:50 AM  Clinical Social Worker:  4/26/20169:50 AM  Other: Mercy RidingValerie, [facility] TCT 4/26/20169:50 AM  Other: Onnie BoerJennifer Clark, RN,UR 4/26/20169:50 AM  Other:  4/26/20169:50 AM  Scribe for Treatment Team:  Santa GeneraAnne Herlinda Heady LCSW 03/15/2015 9:50 AM

## 2015-03-15 NOTE — [facility] Group Notes (Signed)
[facility] LCSW Group Therapy  03/15/2015   11:15 PM   Type of Therapy:  Group Therapy  Participation Level:  Active  Participation Quality:  Attentive, Sharing and Supportive  Affect:  Appropriate  Cognitive:  Alert and Oriented  Insight:  Developing/Improving and Engaged  Engagement in Therapy:  Developing/Improving and Engaged  Modes of Intervention:  Clarification, Confrontation, Discussion, Education, Exploration, Limit-setting, Orientation, Problem-solving, Rapport Building, Dance movement psychotherapisteality Testing, Socialization and Support  Summary of Progress/Problems: The topic for group therapy was feelings about diagnosis.  Pt actively participated in group discussion on their past and current diagnosis and how they feel towards this.  Pt also identified how society and family members judge them, based on their diagnosis as well as stereotypes and stigmas.    Patient identified connection between self esteem and receiving help from others.  States that when he feels good about himself, he has an easier time accepting help.  Recognizes that sometimes help from others comes w expectation of control, which he does not like.

## 2015-03-15 NOTE — Progress Notes (Signed)
Discharge note:  Patient discharged home per MD order.  Patient received all personal belongings, medication samples and prescriptions.  He denies SI/HI/AVH.  [facility].  Patient received 400 mg ability injection and tolerated well.  He left ambulatory with his mother.

## 2015-03-15 NOTE — Progress Notes (Signed)
D: Pt was pleasant but didn't engage the Clinical research associatewriter in conversation. Informed the writer that his day was "ok". Stated that he's ready for discharge, with plans to "stay with his mother until he gets himself together".   A:  Support and encouragement was offered. 15 min checks continued for safety.  R: Pt remains safe.

## 2015-03-15 NOTE — [facility] Suicide Risk Assessment (Signed)
[facility]   Demographic Factors:  Male  Total Time spent with patient: 30 minutes  Musculoskeletal: Strength & Muscle Tone: within normal limits Gait & Station: normal Patient leans: N/A  Psychiatric Specialty Exam: Physical Exam  ROS  Blood pressure 98/76, pulse 65, temperature 98.2 F (36.8 C), temperature source Oral, resp. rate 18, height 5\' 11"  (1.803 m), weight 83.462 kg (184 lb).Body mass index is 25.67 kg/(m^2).  General Appearance: Casual  Eye Contact::  Fair  Speech:  Clear and Coherent409  Volume:  Normal  Mood:  Euthymic  Affect:  Appropriate  Thought Process:  Coherent  Orientation:  Full (Time, Place, and Person)  Thought Content:  WDL  Suicidal Thoughts:  No  Homicidal Thoughts:  No  Memory:  Immediate;   Fair Recent;   Fair Remote;   Fair  Judgement:  Fair  Insight:  Shallow  Psychomotor Activity:  Normal  Concentration:  Fair  Recall:  [name]  Fund of Knowledge:Fair  Language: Fair  Akathisia:  No  Handed:  Right  AIMS (if indicated):     Assets:  Communication Skills Desire for Improvement Social Support  Sleep:  Number of Hours: 3  Cognition: WNL  ADL's:  Intact   Have you used any form of tobacco in the last 30 days? (Cigarettes, Smokeless Tobacco, Cigars, and/or Pipes): Yes  Has this patient used any form of tobacco in the last 30 days? (Cigarettes, Smokeless Tobacco, Cigars, and/or Pipes) Yes, Prescription given nicotine patch  Mental Status Per Nursing Assessment::   On Admission:     Current Mental Status by Physician: pt denies SI/HI/AH/VH  Loss Factors: NA  Historical Factors: Impulsivity  Risk Reduction Factors:   Living with another person, especially a relative and Positive social support  Continued Clinical Symptoms:  Unstable or Poor Therapeutic Relationship Previous Psychiatric Diagnoses and Treatments  Cognitive Features That Contribute To Risk:  Polarized thinking    Suicide Risk:   Minimal: No identifiable suicidal ideation.  Patients presenting with no risk factors but with morbid ruminations; may be classified as minimal risk based on the severity of the depressive symptoms  Principal Problem: Schizoaffective disorder, bipolar type Discharge Diagnoses:  Patient Active Problem List   Diagnosis Date Noted  . Schizoaffective disorder, bipolar type [F25.0] 03/10/2015  . TOBACCO USER [Z72.0] 08/02/2009    Follow-up Information    Follow up with [facility]. Go on 03/18/2015.   Why:  Please go to the walk-in clinic Friday, March 18, 2015 or any weekday between 8:00am and 3:00pm for medication management. Take all of your hospital paperwork.     Contact information:   [address] [place], [place]  [address]  [phone]      Plan Of Care/Follow-up recommendations:  Activity:  No restrictions Diet:  pt to have regular diet Tests:  as needed Other:  abilify mainatenna IM 400 mg q 28 days , first dose today 03/15/2015  Is patient on multiple antipsychotic therapies at discharge:  No   Has Patient had three or more failed trials of antipsychotic monotherapy by history:  No  Recommended Plan for Multiple Antipsychotic Therapies: NA    Abdelaziz Westenberger MD 03/15/2015, 11:18 AM

## 2015-03-16 NOTE — Progress Notes (Signed)
  [facility] :  Will you be returning to the same living situation after discharge:  Yes,  will return to parents home At discharge, do you have transportation home?: Yes,  parents will pick up Do you have the ability to pay for your medications: Yes,  will be working w TCT and has insurance  Release of information consent forms completed and in the chart;  Patient's signature needed at discharge.  Patient to Follow up at: Follow-up Information    Follow up with [facility]. Go on 03/15/2015.   Why:  After care appointment coordinated by the Transistion Care Team.  The Transition Care Team with contact patient regarding appointment.   Contact information:   [address] [place], [place]  [address]  [phone]      Patient denies SI/HI: Yes,  per [facility] at [facility]    Safety Planning and Suicide Prevention discussed: Yes,  w mother and in group w patient.  Have you used any form of tobacco in the last 30 days? (Cigarettes, Smokeless Tobacco, Cigars, and/or Pipes): Yes  Has patient been referred to the Quitline?: Patient refused referral  Sallee LangeCunningham, Anne C 03/16/2015, 10:03 AM

## 2015-03-18 NOTE — Progress Notes (Signed)
Patient Discharge Instructions:  After Visit Summary (AVS):   Faxed to:  03/18/15 Discharge Summary Note:   Faxed to:  03/18/15 Psychiatric Admission Assessment Note:   Faxed to:  03/18/15 Suicide Risk Assessment - Discharge Assessment:   Faxed to:  03/18/15 Faxed/Sent to the Next Level Care provider:  03/18/15 [facility]axed to [facility] @ [phone] Jerelene ReddenSheena E Drummond, 03/18/2015, 4:08 PM

## 2015-10-02 ENCOUNTER — Emergency Department (HOSPITAL_COMMUNITY): Payer: Medicaid Other

## 2015-10-02 ENCOUNTER — Emergency Department (HOSPITAL_COMMUNITY): Payer: Medicaid Other | Admitting: Certified Registered"

## 2015-10-02 ENCOUNTER — Encounter (HOSPITAL_COMMUNITY): Payer: Self-pay | Admitting: Certified Registered"

## 2015-10-02 ENCOUNTER — Observation Stay (HOSPITAL_COMMUNITY)
Admission: EM | Admit: 2015-10-02 | Discharge: 2015-10-03 | Disposition: A | Discharge status: Home / Self Care | Payer: Medicaid Other | Attending: General Surgery | Admitting: General Surgery

## 2015-10-02 ENCOUNTER — Encounter (HOSPITAL_COMMUNITY)
Admission: EM | Disposition: A | Discharge status: Home / Self Care | Payer: Self-pay | Source: Home / Self Care | Attending: Emergency Medicine

## 2015-10-02 DIAGNOSIS — W3400XA Accidental discharge from unspecified firearms or gun, initial encounter: Secondary | ICD-10-CM

## 2015-10-02 DIAGNOSIS — S31139A Puncture wound of abdominal wall without foreign body, unspecified quadrant without penetration into peritoneal cavity, initial encounter: Secondary | ICD-10-CM

## 2015-10-02 DIAGNOSIS — F17210 Nicotine dependence, cigarettes, uncomplicated: Secondary | ICD-10-CM | POA: Insufficient documentation

## 2015-10-02 DIAGNOSIS — S31134A Puncture wound of abdominal wall without foreign body, left lower quadrant without penetration into peritoneal cavity, initial encounter: Principal | ICD-10-CM | POA: Insufficient documentation

## 2015-10-02 DIAGNOSIS — T148 Other injury of unspecified body region: Secondary | ICD-10-CM | POA: Diagnosis present

## 2015-10-02 DIAGNOSIS — T1490XA Injury, unspecified, initial encounter: Secondary | ICD-10-CM

## 2015-10-02 HISTORY — PX: LAPAROTOMY: SHX154

## 2015-10-02 LAB — COMPREHENSIVE METABOLIC PANEL
ALBUMIN: 4.3 g/dL (ref 3.5–5.0)
ALT: 12 U/L — ABNORMAL LOW (ref 17–63)
AST: 24 U/L (ref 15–41)
Alkaline Phosphatase: 74 U/L (ref 38–126)
Anion gap: 9 (ref 5–15)
BUN: 15 mg/dL (ref 6–20)
CHLORIDE: 103 mmol/L (ref 101–111)
CO2: 26 mmol/L (ref 22–32)
CREATININE: 1.23 mg/dL (ref 0.61–1.24)
Calcium: 9.1 mg/dL (ref 8.9–10.3)
GFR calc Af Amer: >60 mL/min (ref >60)
GFR calc non Af Amer: >60 mL/min (ref >60)
Glucose, Bld: 124 mg/dL — ABNORMAL HIGH (ref 65–99)
POTASSIUM: 3.4 mmol/L — AB (ref 3.5–5.1)
SODIUM: 138 mmol/L (ref 135–145)
Total Bilirubin: 0.3 mg/dL (ref 0.3–1.2)
Total Protein: 7.2 g/dL (ref 6.5–8.1)

## 2015-10-02 LAB — CBC
HEMATOCRIT: 41.1 % (ref 39.0–52.0)
Hemoglobin: 14.4 g/dL (ref 13.0–17.0)
MCH: 34.9 pg — AB (ref 26.0–34.0)
MCHC: 35 g/dL (ref 30.0–36.0)
MCV: 99.5 fL (ref 78.0–100.0)
PLATELETS: 219 10*3/uL (ref 150–400)
RBC: 4.13 MIL/uL — ABNORMAL LOW (ref 4.22–5.81)
RDW: 12.2 % (ref 11.5–15.5)
WBC: 9 10*3/uL (ref 4.0–10.5)

## 2015-10-02 LAB — PROTIME-INR
INR: 1.06 (ref 0.00–1.49)
Prothrombin Time: 14 seconds (ref 11.6–15.2)

## 2015-10-02 LAB — ETHANOL: Alcohol, Ethyl (B): <5 mg/dL (ref <5)

## 2015-10-02 LAB — CDS SEROLOGY

## 2015-10-02 SURGERY — LAPAROTOMY, EXPLORATORY
Anesthesia: General | Site: Abdomen

## 2015-10-02 MED ORDER — BUPIVACAINE-EPINEPHRINE (PF) 0.25% -1:200000 IJ SOLN
INTRAMUSCULAR | Status: AC
  Filled 2015-10-02: qty 30

## 2015-10-02 MED ORDER — LACTATED RINGERS IV SOLN
Freq: Once | INTRAVENOUS | Status: AC
  Administered 2015-10-02: via INTRAVENOUS

## 2015-10-02 MED ORDER — FENTANYL CITRATE (PF) 100 MCG/2ML IJ SOLN
INTRAMUSCULAR | Status: AC
  Administered 2015-10-02: 100 ug via INTRAVENOUS
  Filled 2015-10-02: qty 2

## 2015-10-02 MED ORDER — MEPERIDINE HCL 25 MG/ML IJ SOLN
6.2500 mg | INTRAMUSCULAR | Status: DC | PRN
  Administered 2015-10-02: 12.5 mg via INTRAVENOUS

## 2015-10-02 MED ORDER — NEOSTIGMINE METHYLSULFATE 10 MG/10ML IV SOLN
INTRAVENOUS | Status: DC | PRN
  Administered 2015-10-02: 3 mg via INTRAVENOUS

## 2015-10-02 MED ORDER — HYDROMORPHONE HCL 1 MG/ML IJ SOLN
0.2500 mg | INTRAMUSCULAR | Status: DC | PRN
  Administered 2015-10-02 – 2015-10-03 (×4): 0.5 mg via INTRAVENOUS

## 2015-10-02 MED ORDER — FENTANYL CITRATE (PF) 250 MCG/5ML IJ SOLN
INTRAMUSCULAR | Status: AC
  Filled 2015-10-02: qty 5

## 2015-10-02 MED ORDER — PROPOFOL 10 MG/ML IV BOLUS
INTRAVENOUS | Status: AC
  Filled 2015-10-02: qty 20

## 2015-10-02 MED ORDER — PROPOFOL 10 MG/ML IV BOLUS
INTRAVENOUS | Status: DC | PRN
  Administered 2015-10-02: 140 mg via INTRAVENOUS

## 2015-10-02 MED ORDER — GLYCOPYRROLATE 0.2 MG/ML IJ SOLN
INTRAMUSCULAR | Status: AC
  Filled 2015-10-02: qty 2

## 2015-10-02 MED ORDER — HYDROMORPHONE HCL 1 MG/ML IJ SOLN
INTRAMUSCULAR | Status: AC
  Filled 2015-10-02: qty 1

## 2015-10-02 MED ORDER — FENTANYL CITRATE (PF) 100 MCG/2ML IJ SOLN
100.0000 ug | Freq: Once | INTRAMUSCULAR | Status: AC
  Administered 2015-10-02: 100 ug via INTRAVENOUS

## 2015-10-02 MED ORDER — LIDOCAINE HCL (CARDIAC) 20 MG/ML IV SOLN
INTRAVENOUS | Status: AC
  Filled 2015-10-02: qty 5

## 2015-10-02 MED ORDER — BUPIVACAINE-EPINEPHRINE 0.25% -1:200000 IJ SOLN
INTRAMUSCULAR | Status: DC | PRN
  Administered 2015-10-02: 2 mL

## 2015-10-02 MED ORDER — GLYCOPYRROLATE 0.2 MG/ML IJ SOLN
INTRAMUSCULAR | Status: DC | PRN
  Administered 2015-10-02: 0.4 mg via INTRAVENOUS

## 2015-10-02 MED ORDER — FENTANYL CITRATE (PF) 100 MCG/2ML IJ SOLN
INTRAMUSCULAR | Status: DC | PRN
  Administered 2015-10-02: 150 ug via INTRAVENOUS

## 2015-10-02 MED ORDER — NEOSTIGMINE METHYLSULFATE 10 MG/10ML IV SOLN
INTRAVENOUS | Status: AC
  Filled 2015-10-02: qty 1

## 2015-10-02 MED ORDER — ONDANSETRON HCL 4 MG/2ML IJ SOLN: 4.0000 mg | Freq: Once | INTRAMUSCULAR | Status: DC | PRN

## 2015-10-02 MED ORDER — 0.9 % SODIUM CHLORIDE (POUR BTL) OPTIME
TOPICAL | Status: DC | PRN
  Administered 2015-10-02: 1000 mL

## 2015-10-02 MED ORDER — ENOXAPARIN SODIUM 30 MG/0.3ML ~~LOC~~ SOLN: 30.0000 mg | Freq: Two times a day (BID) | SUBCUTANEOUS | Status: DC

## 2015-10-02 MED ORDER — ONDANSETRON HCL 4 MG/2ML IJ SOLN
INTRAMUSCULAR | Status: AC
  Filled 2015-10-02: qty 2

## 2015-10-02 MED ORDER — SUCCINYLCHOLINE CHLORIDE 20 MG/ML IJ SOLN
INTRAMUSCULAR | Status: DC | PRN
  Administered 2015-10-02: 150 mg via INTRAVENOUS

## 2015-10-02 MED ORDER — MIDAZOLAM HCL 5 MG/5ML IJ SOLN
INTRAMUSCULAR | Status: DC | PRN
  Administered 2015-10-02: 2 mg via INTRAVENOUS

## 2015-10-02 MED ORDER — HYDROMORPHONE HCL 1 MG/ML IJ SOLN
INTRAMUSCULAR | Status: AC
  Administered 2015-10-03: 1 mg via INTRAVENOUS
  Filled 2015-10-02: qty 1

## 2015-10-02 MED ORDER — ONDANSETRON HCL 4 MG/2ML IJ SOLN
INTRAMUSCULAR | Status: DC | PRN
  Administered 2015-10-02: 4 mg via INTRAVENOUS

## 2015-10-02 MED ORDER — LIDOCAINE HCL (CARDIAC) 20 MG/ML IV SOLN
INTRAVENOUS | Status: DC | PRN
  Administered 2015-10-02: 100 mg via INTRAVENOUS

## 2015-10-02 MED ORDER — ROCURONIUM BROMIDE 100 MG/10ML IV SOLN
INTRAVENOUS | Status: DC | PRN
  Administered 2015-10-02: 25 mg via INTRAVENOUS

## 2015-10-02 MED ORDER — CEFOTETAN DISODIUM-DEXTROSE 2-2.08 GM-% IV SOLR
INTRAVENOUS | Status: AC
  Filled 2015-10-02: qty 50

## 2015-10-02 MED ORDER — DEXTROSE-NACL 5-0.9 % IV SOLN
INTRAVENOUS | Status: DC
  Administered 2015-10-03: via INTRAVENOUS

## 2015-10-02 MED ORDER — LACTATED RINGERS IV SOLN
INTRAVENOUS | Status: DC | PRN
  Administered 2015-10-02: 22:00:00 via INTRAVENOUS

## 2015-10-02 MED ORDER — MIDAZOLAM HCL 2 MG/2ML IJ SOLN
INTRAMUSCULAR | Status: AC
  Filled 2015-10-02: qty 4

## 2015-10-02 MED ORDER — MEPERIDINE HCL 25 MG/ML IJ SOLN
INTRAMUSCULAR | Status: AC
  Filled 2015-10-02: qty 1

## 2015-10-02 MED ORDER — DEXTROSE 5 % IV SOLN
2.0000 g | INTRAVENOUS | Status: DC | PRN
  Administered 2015-10-02: 2 g via INTRAVENOUS

## 2015-10-02 SURGICAL SUPPLY — 43 items
APL SKNCLS STERI-STRIP NONHPOA (GAUZE/BANDAGES/DRESSINGS) ×1
BENZOIN TINCTURE PRP APPL 2/3 (GAUZE/BANDAGES/DRESSINGS) ×1 IMPLANT
BLADE SURG ROTATE 9660 (MISCELLANEOUS) ×1 IMPLANT
CANISTER SUCTION 2500CC (MISCELLANEOUS) ×2 IMPLANT
CHLORAPREP W/TINT 26ML (MISCELLANEOUS) ×2 IMPLANT
COVER MAYO STAND STRL (DRAPES) IMPLANT
COVER SURGICAL LIGHT HANDLE (MISCELLANEOUS) ×2 IMPLANT
DRAPE LAPAROSCOPIC ABDOMINAL (DRAPES) ×2 IMPLANT
DRAPE PROXIMA HALF (DRAPES) IMPLANT
DRAPE UTILITY XL STRL (DRAPES) ×2 IMPLANT
DRAPE WARM FLUID 44X44 (DRAPE) ×1 IMPLANT
ELECT REM PT RETURN 9FT ADLT (ELECTROSURGICAL) ×2
ELECTRODE REM PT RTRN 9FT ADLT (ELECTROSURGICAL) ×1 IMPLANT
GAUZE SPONGE 2X2 8PLY STRL LF (GAUZE/BANDAGES/DRESSINGS) IMPLANT
GLOVE BIO SURGEON STRL SZ7.5 (GLOVE) ×3 IMPLANT
GLOVE BIOGEL PI IND STRL 8 (GLOVE) ×1 IMPLANT
GLOVE BIOGEL PI INDICATOR 8 (GLOVE) ×2
GOWN STRL REUS W/ TWL LRG LVL3 (GOWN DISPOSABLE) ×2 IMPLANT
GOWN STRL REUS W/ TWL XL LVL3 (GOWN DISPOSABLE) ×1 IMPLANT
GOWN STRL REUS W/TWL LRG LVL3 (GOWN DISPOSABLE) ×2
GOWN STRL REUS W/TWL XL LVL3 (GOWN DISPOSABLE) ×2
KIT BASIN OR (CUSTOM PROCEDURE TRAY) ×2 IMPLANT
KIT ROOM TURNOVER OR (KITS) ×2 IMPLANT
LIGASURE IMPACT 36 18CM CVD LR (INSTRUMENTS) IMPLANT
NDL INSUFFLATION 14GA 120MM (NEEDLE) IMPLANT
NEEDLE INSUFFLATION 14GA 120MM (NEEDLE) ×2 IMPLANT
NS IRRIG 1000ML POUR BTL (IV SOLUTION) ×3 IMPLANT
PAD ARMBOARD 7.5X6 YLW CONV (MISCELLANEOUS) ×3 IMPLANT
PENCIL BUTTON HOLSTER BLD 10FT (ELECTRODE) IMPLANT
SCISSORS LAP 5X35 DISP (ENDOMECHANICALS) ×1 IMPLANT
SET IRRIG TUBING LAPAROSCOPIC (IRRIGATION / IRRIGATOR) ×1 IMPLANT
SPONGE GAUZE 2X2 STER 10/PKG (GAUZE/BANDAGES/DRESSINGS) ×1
SPONGE LAP 18X18 X RAY DECT (DISPOSABLE) IMPLANT
STAPLER VISISTAT 35W (STAPLE) ×1 IMPLANT
STRIP CLOSURE SKIN 1/2X4 (GAUZE/BANDAGES/DRESSINGS) ×1 IMPLANT
SUT MNCRL AB 4-0 PS2 18 (SUTURE) ×2 IMPLANT
TAPE CLOTH SOFT 2X10 (GAUZE/BANDAGES/DRESSINGS) ×1 IMPLANT
TOWEL OR 17X26 10 PK STRL BLUE (TOWEL DISPOSABLE) ×3 IMPLANT
TRAY FOLEY CATH 16FRSI W/METER (SET/KITS/TRAYS/PACK) ×1 IMPLANT
TRAY LAPAROSCOPIC MC (CUSTOM PROCEDURE TRAY) ×1 IMPLANT
TROCAR BLADELESS 5MM (ENDOMECHANICALS) ×3 IMPLANT
TUBE CONNECTING 12X1/4 (SUCTIONS) IMPLANT
YANKAUER SUCT BULB TIP NO VENT (SUCTIONS) IMPLANT

## 2015-10-02 NOTE — ED Notes (Signed)
[employer] at bedside questioning patient.
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
## 2015-10-02 NOTE — Transfer of Care (Signed)
Immediate Anesthesia Transfer of Care Note  Patient: Jerry [name]  Procedure(s) Performed: Procedure(s): DIAGNOSTIC LAPAROSCOPY, WASHOUT OF WOUND  (N/A)  Patient Location: PACU  Anesthesia Type:General  Level of Consciousness: awake, alert  and oriented  Airway & Oxygen Therapy: Patient Spontanous Breathing and Patient connected to nasal cannula oxygen  Post-op Assessment: Report given to RN and Post -op Vital signs reviewed and stable  Post vital signs: Reviewed and stable  Last Vitals:  Filed Vitals:   10/02/15 2153  BP:   Pulse: 85  Temp:   Resp: 14    Complications: No apparent anesthesia complications
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
## 2015-10-02 NOTE — Anesthesia Procedure Notes (Signed)
Procedure Name: Intubation Date/Time: 10/02/2015 10:17 PM Performed by: Arlice ColtMANESS, Ernestine Langworthy B Pre-anesthesia Checklist: Patient identified, Emergency Drugs available, Suction available, Patient being monitored and Timeout performed Patient Re-evaluated:Patient Re-evaluated prior to inductionOxygen Delivery Method: Circle system utilized Preoxygenation: Pre-oxygenation with 100% oxygen Intubation Type: IV induction, Rapid sequence and Cricoid Pressure applied Laryngoscope Size: Mac and 3 Grade View: Grade I Tube type: Subglottic suction tube Tube size: 8.0 mm Number of attempts: 1 Airway Equipment and Method: Stylet Placement Confirmation: ETT inserted through vocal cords under direct vision,  positive ETCO2 and breath sounds checked- equal and bilateral Secured at: 23 cm Tube secured with: Tape Dental Injury: Teeth and Oropharynx as per pre-operative assessment

## 2015-10-02 NOTE — Anesthesia Preprocedure Evaluation (Addendum)
Anesthesia Evaluation  Patient identified by MRN, date of birth, ID band Patient awake    Reviewed: Allergy & Precautions, NPO status , Patient's Chart, lab work & pertinent test results  History of Anesthesia Complications Negative for: history of anesthetic complications (no prior surgeries)  Airway Mallampati: I  TM Distance: >3 FB Neck ROM: Full    Dental  (+) Teeth Intact, Dental Advisory Given   Pulmonary Current Smoker,    Pulmonary exam normal        Cardiovascular Normal cardiovascular exam     Neuro/Psych    GI/Hepatic   Endo/Other    Renal/GU      Musculoskeletal   Abdominal   Peds  Hematology   Anesthesia Other Findings   Reproductive/Obstetrics                            Anesthesia Physical Anesthesia Plan  ASA: II and emergent  Anesthesia Plan: General   Post-op Pain Management:    Induction: Intravenous, Rapid sequence and Cricoid pressure planned  Airway Management Planned: Oral ETT  Additional Equipment:   Intra-op Plan:   Post-operative Plan: Extubation in OR  Informed Consent: I have reviewed the patients History and Physical, chart, labs and discussed the procedure including the risks, benefits and alternatives for the proposed anesthesia with the patient or authorized representative who has indicated his/her understanding and acceptance.     Plan Discussed with: Surgeon and CRNA  Anesthesia Plan Comments:        Anesthesia Quick Evaluation

## 2015-10-02 NOTE — ED Notes (Signed)
Pt escorted to OR by this RN. [facility]. ALL BELONGINGS GIVEN TO [employer]. NO BELONGINGS AT BEDSIDE. Surgical consent signed and carried to OR.
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
## 2015-10-03 ENCOUNTER — Encounter (HOSPITAL_COMMUNITY): Payer: Self-pay | Admitting: General Surgery

## 2015-10-03 LAB — BASIC METABOLIC PANEL
ANION GAP: 7 (ref 5–15)
BUN: 14 mg/dL (ref 6–20)
CHLORIDE: 104 mmol/L (ref 101–111)
CO2: 28 mmol/L (ref 22–32)
CREATININE: 1.16 mg/dL (ref 0.61–1.24)
Calcium: 8.8 mg/dL — ABNORMAL LOW (ref 8.9–10.3)
GFR calc non Af Amer: >60 mL/min (ref >60)
Glucose, Bld: 121 mg/dL — ABNORMAL HIGH (ref 65–99)
Potassium: 3.6 mmol/L (ref 3.5–5.1)
Sodium: 139 mmol/L (ref 135–145)

## 2015-10-03 LAB — CBC
HEMATOCRIT: 40 % (ref 39.0–52.0)
HEMOGLOBIN: 13.6 g/dL (ref 13.0–17.0)
MCH: 34.1 pg — ABNORMAL HIGH (ref 26.0–34.0)
MCHC: 34 g/dL (ref 30.0–36.0)
MCV: 100.3 fL — ABNORMAL HIGH (ref 78.0–100.0)
Platelets: 198 10*3/uL (ref 150–400)
RBC: 3.99 MIL/uL — ABNORMAL LOW (ref 4.22–5.81)
RDW: 12.2 % (ref 11.5–15.5)
WBC: 12.4 10*3/uL — ABNORMAL HIGH (ref 4.0–10.5)

## 2015-10-03 LAB — TYPE AND SCREEN
ABO/RH(D): O POS
ANTIBODY SCREEN: NEGATIVE
UNIT DIVISION: 0
Unit division: 0

## 2015-10-03 LAB — BLOOD PRODUCT ORDER (VERBAL) VERIFICATION

## 2015-10-03 LAB — PREPARE FRESH FROZEN PLASMA
UNIT DIVISION: 0
Unit division: 0

## 2015-10-03 LAB — ABO/RH: ABO/RH(D): O POS

## 2015-10-03 MED ORDER — ONDANSETRON HCL 4 MG/2ML IJ SOLN: 4.0000 mg | Freq: Four times a day (QID) | INTRAMUSCULAR | Status: DC | PRN

## 2015-10-03 MED ORDER — PNEUMOCOCCAL VAC POLYVALENT 25 MCG/0.5ML IJ INJ: 0.5000 mL | INJECTION | INTRAMUSCULAR | Status: DC | PRN

## 2015-10-03 MED ORDER — HYDROCODONE-ACETAMINOPHEN 10-325 MG PO TABS
0.5000 | ORAL_TABLET | ORAL | Status: DC | PRN
  Administered 2015-10-03 (×2): 1 via ORAL
  Filled 2015-10-03 (×2): qty 1

## 2015-10-03 MED ORDER — NAPROXEN 250 MG PO TABS
500.0000 mg | ORAL_TABLET | Freq: Two times a day (BID) | ORAL | Status: DC
  Administered 2015-10-03: 500 mg via ORAL
  Filled 2015-10-03: qty 2

## 2015-10-03 MED ORDER — HYDROCODONE-ACETAMINOPHEN 5-325 MG PO TABS: 1.0000 | ORAL_TABLET | Freq: Four times a day (QID) | ORAL | Status: DC | PRN

## 2015-10-03 MED ORDER — ONDANSETRON HCL 4 MG PO TABS: 4.0000 mg | ORAL_TABLET | Freq: Four times a day (QID) | ORAL | Status: DC | PRN

## 2015-10-03 MED ORDER — NAPROXEN 500 MG PO TABS: 500.0000 mg | ORAL_TABLET | Freq: Two times a day (BID) | ORAL | Status: DC

## 2015-10-03 MED ORDER — HYDROMORPHONE HCL 1 MG/ML IJ SOLN: 0.5000 mg | INTRAMUSCULAR | Status: DC | PRN

## 2015-10-03 MED ORDER — HYDROMORPHONE HCL 1 MG/ML IJ SOLN
1.0000 mg | INTRAMUSCULAR | Status: DC | PRN
  Administered 2015-10-03: 1 mg via INTRAVENOUS
  Filled 2015-10-03: qty 1
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
## 2015-10-03 NOTE — Progress Notes (Signed)
Reviewed AVS with patient. Patient was given prescription Hydrocodone to take to his pharmacy, patient was also reminded that he had a prescription naproxen to pick up at his pharmacy. Staff assisted patient to his transportation.
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
## 2015-10-03 NOTE — Anesthesia Postprocedure Evaluation (Signed)
Anesthesia Post Note  Patient: Jerry [name]  Procedure(s) Performed: Procedure(s) (LRB): DIAGNOSTIC LAPAROSCOPY, WASHOUT OF WOUND  (N/A)  Anesthesia type: general  Patient location: PACU  Post pain: Pain level controlled  Post assessment: Patient's Cardiovascular Status Stable  Last Vitals:  Filed Vitals:   10/03/15 0504  BP: 114/65  Pulse: 55  Temp: 36.8 C  Resp: 18    Post vital signs: Reviewed and stable  Level of consciousness: sedated  Complications: No apparent anesthesia complications
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
## 2015-10-06 ENCOUNTER — Encounter (HOSPITAL_COMMUNITY): Payer: Self-pay | Admitting: Psychiatry

## 2015-10-11 ENCOUNTER — Encounter (HOSPITAL_COMMUNITY): Payer: Self-pay | Admitting: Emergency Medicine

## 2015-10-11 ENCOUNTER — Emergency Department (HOSPITAL_COMMUNITY)
Admission: EM | Admit: 2015-10-11 | Discharge: 2015-10-11 | Disposition: A | Discharge status: Home / Self Care | Payer: Medicaid Other | Attending: Emergency Medicine | Admitting: Emergency Medicine

## 2015-10-11 ENCOUNTER — Emergency Department (HOSPITAL_COMMUNITY): Payer: Medicaid Other

## 2015-10-11 DIAGNOSIS — Z87828 Personal history of other (healed) physical injury and trauma: Secondary | ICD-10-CM | POA: Insufficient documentation

## 2015-10-11 DIAGNOSIS — Z4801 Encounter for change or removal of surgical wound dressing: Secondary | ICD-10-CM | POA: Insufficient documentation

## 2015-10-11 DIAGNOSIS — Z79899 Other long term (current) drug therapy: Secondary | ICD-10-CM | POA: Diagnosis not present

## 2015-10-11 DIAGNOSIS — F17210 Nicotine dependence, cigarettes, uncomplicated: Secondary | ICD-10-CM | POA: Insufficient documentation

## 2015-10-11 DIAGNOSIS — Z5189 Encounter for other specified aftercare: Secondary | ICD-10-CM

## 2015-10-11 DIAGNOSIS — R109 Unspecified abdominal pain: Secondary | ICD-10-CM | POA: Insufficient documentation

## 2015-10-11 LAB — COMPREHENSIVE METABOLIC PANEL
ALBUMIN: 4 g/dL (ref 3.5–5.0)
ALT: 19 U/L (ref 17–63)
ANION GAP: 8 (ref 5–15)
AST: 20 U/L (ref 15–41)
Alkaline Phosphatase: 63 U/L (ref 38–126)
BUN: 16 mg/dL (ref 6–20)
CHLORIDE: 104 mmol/L (ref 101–111)
CO2: 28 mmol/L (ref 22–32)
Calcium: 9.3 mg/dL (ref 8.9–10.3)
Creatinine, Ser: 1.14 mg/dL (ref 0.61–1.24)
GFR calc non Af Amer: >60 mL/min (ref >60)
GLUCOSE: 104 mg/dL — AB (ref 65–99)
Potassium: 3.7 mmol/L (ref 3.5–5.1)
SODIUM: 140 mmol/L (ref 135–145)
Total Bilirubin: 0.4 mg/dL (ref 0.3–1.2)
Total Protein: 6.8 g/dL (ref 6.5–8.1)

## 2015-10-11 LAB — CBC
HCT: 39.6 % (ref 39.0–52.0)
HEMOGLOBIN: 13.6 g/dL (ref 13.0–17.0)
MCH: 34.6 pg — AB (ref 26.0–34.0)
MCHC: 34.3 g/dL (ref 30.0–36.0)
MCV: 100.8 fL — AB (ref 78.0–100.0)
Platelets: 252 10*3/uL (ref 150–400)
RBC: 3.93 MIL/uL — AB (ref 4.22–5.81)
RDW: 12.3 % (ref 11.5–15.5)
WBC: 6.3 10*3/uL (ref 4.0–10.5)

## 2015-10-11 LAB — URINALYSIS, ROUTINE W REFLEX MICROSCOPIC
Bilirubin Urine: NEGATIVE
GLUCOSE, UA: NEGATIVE mg/dL
HGB URINE DIPSTICK: NEGATIVE
Ketones, ur: NEGATIVE mg/dL
Nitrite: NEGATIVE
PH: 7 (ref 5.0–8.0)
Protein, ur: NEGATIVE mg/dL
SPECIFIC GRAVITY, URINE: 1.03 (ref 1.005–1.030)

## 2015-10-11 LAB — URINE MICROSCOPIC-ADD ON: RBC / HPF: NONE SEEN RBC/hpf (ref 0–5)

## 2015-10-11 LAB — LIPASE, BLOOD: LIPASE: 30 U/L (ref 11–51)

## 2015-10-11 MED ORDER — HYDROCODONE-ACETAMINOPHEN 5-325 MG PO TABS: 1.0000 | ORAL_TABLET | ORAL | Status: DC | PRN
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
## 2015-11-21 ENCOUNTER — Encounter (HOSPITAL_COMMUNITY): Payer: Self-pay | Admitting: *Deleted

## 2015-11-21 ENCOUNTER — Emergency Department (HOSPITAL_COMMUNITY)
Admission: EM | Admit: 2015-11-21 | Discharge: 2015-11-21 | Disposition: A | Discharge status: Home / Self Care | Payer: Medicaid Other | Attending: Emergency Medicine | Admitting: Emergency Medicine

## 2015-11-21 ENCOUNTER — Emergency Department (HOSPITAL_COMMUNITY): Payer: Medicaid Other

## 2015-11-21 DIAGNOSIS — F17210 Nicotine dependence, cigarettes, uncomplicated: Secondary | ICD-10-CM | POA: Diagnosis not present

## 2015-11-21 DIAGNOSIS — R51 Headache: Secondary | ICD-10-CM

## 2015-11-21 DIAGNOSIS — S0993XA Unspecified injury of face, initial encounter: Secondary | ICD-10-CM | POA: Diagnosis present

## 2015-11-21 DIAGNOSIS — Y9389 Activity, other specified: Secondary | ICD-10-CM | POA: Insufficient documentation

## 2015-11-21 DIAGNOSIS — Y9289 Other specified places as the place of occurrence of the external cause: Secondary | ICD-10-CM | POA: Insufficient documentation

## 2015-11-21 DIAGNOSIS — Y998 Other external cause status: Secondary | ICD-10-CM | POA: Diagnosis not present

## 2015-11-21 DIAGNOSIS — S0990XA Unspecified injury of head, initial encounter: Secondary | ICD-10-CM | POA: Diagnosis not present

## 2015-11-21 DIAGNOSIS — W01198A Fall on same level from slipping, tripping and stumbling with subsequent striking against other object, initial encounter: Secondary | ICD-10-CM | POA: Diagnosis not present

## 2015-11-21 DIAGNOSIS — W19XXXA Unspecified fall, initial encounter: Secondary | ICD-10-CM

## 2015-11-21 DIAGNOSIS — S199XXA Unspecified injury of neck, initial encounter: Secondary | ICD-10-CM | POA: Diagnosis not present

## 2015-11-21 DIAGNOSIS — R519 Headache, unspecified: Secondary | ICD-10-CM

## 2015-11-21 MED ORDER — ACETAMINOPHEN 325 MG PO TABS
650.0000 mg | ORAL_TABLET | Freq: Once | ORAL | Status: AC
  Administered 2015-11-21: 650 mg via ORAL
  Filled 2015-11-21: qty 2

## 2015-11-21 NOTE — ED Provider Notes (Signed)
CSN: [id number]     Arrival date & time 11/21/15  1341 History  By signing my name below, I, Essence Howell, attest that this documentation has been prepared under the direction and in the presence of Glean Hess, PA-C Electronically Signed: Charline Bills, ED Scribe 11/21/2015 at 4:26 PM.  Chief Complaint  Patient presents with  . Jaw Pain    The history is provided by the patient. No language interpreter was used.    HPI Comments: Jerry [name] is a 30 y.o. male who presents to the Emergency Department complaining of a fall that occurred 2 days ago. Pt states that he was intoxicated when he fell and injured his left jaw. Pt reports striking the back of his head during the incident. He denies LOC. Pt reports constant, left jaw pain that is exacerbated with bending and chewing; improved with resting. He further reports associated neck pain that is exacerbated with palpation. Pt states that his last alcoholic beverage was yesterday.   History reviewed. No pertinent past medical history. Past Surgical History  Procedure Laterality Date  . Leg surgery Right   . Laparotomy N/A 10/02/2015    Procedure: DIAGNOSTIC LAPAROSCOPY, WASHOUT OF WOUND ;  Surgeon: Axel Filler, MD;  Location: [facility] OR;  Service: General;  Laterality: N/A;   Family History  Problem Relation Age of Onset  . Arthritis Mother   . Heart Problems Father    Social History  Substance Use Topics  . Smoking status: Current Every Day Smoker -- 1.00 packs/day for 20 years    Types: Cigarettes  . Smokeless tobacco: None  . Alcohol Use: Yes    Review of Systems  HENT:       + Jaw pain  Eyes: Negative for visual disturbance.  Musculoskeletal: Positive for neck pain.  Neurological: Positive for dizziness, light-headedness and headaches. Negative for syncope, weakness and numbness.  All other systems reviewed and are negative.   Allergies  Cogentin and Risperdal  Home Medications   Prior to Admission  medications   Medication Sig Start Date End Date Taking? Authorizing Provider  ARIPiprazole (ABILIFY) 15 MG tablet Take 0.5 tablets (7.5 mg total) by mouth daily. Patient not taking: Reported on 10/11/2015 03/15/15   Thermon Leyland, NP  ARIPiprazole 400 MG SUSR Inject 400 mg into the muscle every 30 (thirty) days. First dose received on March 15, 2015, next dose due in thirty days on Apr 14, 2015 Patient not taking: Reported on 10/11/2015 04/14/15   Thermon Leyland, NP  benztropine (COGENTIN) 0.5 MG tablet Take 1 tablet (0.5 mg total) by mouth daily. Patient not taking: Reported on 10/11/2015 03/15/15   Thermon Leyland, NP  divalproex (DEPAKOTE ER) 500 MG 24 hr tablet Take 1 tablet (500 mg total) by mouth at bedtime. Patient not taking: Reported on 10/11/2015 03/15/15   Thermon Leyland, NP  HYDROcodone-acetaminophen (NORCO) 5-325 MG tablet Take 1 tablet by mouth every 4 (four) hours as needed for moderate pain or severe pain. 10/11/15   Leta Baptist, MD  naproxen (NAPROSYN) 500 MG tablet Take 1 tablet (500 mg total) by mouth 2 (two) times daily with a meal. Patient not taking: Reported on 10/11/2015 10/03/15   Freeman Caldron, PA-C  nicotine (NICODERM CQ - DOSED IN MG/24 HOURS) 21 mg/24hr patch Place 1 patch (21 mg total) onto the skin daily. 03/15/15   Thermon Leyland, NP  traZODone (DESYREL) 100 MG tablet Take 1 tablet (100 mg total) by mouth at bedtime  as needed for sleep (May repeat X1). Patient not taking: Reported on 10/11/2015 03/15/15   Thermon Leyland, NP    BP 128/84 mmHg  Pulse 72  Temp(Src) 98.8 F (37.1 C) (Oral)  Resp 18  SpO2 99% Physical Exam  Constitutional: He is oriented to person, place, and time. He appears well-developed and well-nourished. No distress.  HENT:  Head: Normocephalic and atraumatic. Head is without raccoon's eyes, without Battle's sign, without abrasion, without contusion and without laceration.  Right Ear: External ear normal.  Left Ear: External ear normal.   Nose: Right sinus exhibits maxillary sinus tenderness and frontal sinus tenderness. Left sinus exhibits maxillary sinus tenderness and frontal sinus tenderness.  Mouth/Throat: Uvula is midline, oropharynx is clear and moist and mucous membranes are normal. No oropharyngeal exudate, posterior oropharyngeal edema, posterior oropharyngeal erythema or tonsillar abscesses.  Diffuse TTP of left posterior head. No hematoma or palpable deformity. Diffuse TTP of facial bones, worse over left lower jaw. Patient able to open his mouth without difficulty.  Eyes: Conjunctivae, EOM and lids are normal. Pupils are equal, round, and reactive to light. Right eye exhibits no discharge. Left eye exhibits no discharge. No scleral icterus.  Neck: Normal range of motion. Neck supple. Muscular tenderness present. No spinous process tenderness present.  TTP of cervical paraspinal muscles bilaterally. No midline tenderness, palpable step-off, or deformity.  Cardiovascular: Normal rate, regular rhythm, normal heart sounds, intact distal pulses and normal pulses.   Pulmonary/Chest: Effort normal and breath sounds normal. No respiratory distress. He has no wheezes. He has no rales. He exhibits no tenderness.  Abdominal: Soft. Normal appearance and bowel sounds are normal. He exhibits no distension and no mass. There is no tenderness. There is no rigidity, no rebound and no guarding.  Musculoskeletal: Normal range of motion. He exhibits no edema or tenderness.  Neurological: He is alert and oriented to person, place, and time. He has normal strength. No cranial nerve deficit or sensory deficit.  Skin: Skin is warm, dry and intact. No rash noted. He is not diaphoretic. No erythema. No pallor.  Psychiatric: He has a normal mood and affect. His speech is normal and behavior is normal.  Nursing note and vitals reviewed.   ED Course  Procedures (including critical care time)  DIAGNOSTIC STUDIES: Oxygen Saturation is 99% on RA,  normal by my interpretation.    COORDINATION OF CARE: 2:48 PM-Discussed treatment plan which includes CT head, cervical spine, maxillofacial and tylenol with pt at bedside and pt agreed to plan.   Labs Review Labs Reviewed - No data to display  Imaging Review Ct Head Wo Contrast  11/21/2015  CLINICAL DATA:  Patient reports falling on face after becoming intoxicated. Difficulty eating with a right-sided jaw pain. EXAM: CT HEAD WITHOUT CONTRAST CT MAXILLOFACIAL WITHOUT CONTRAST CT CERVICAL SPINE WITHOUT CONTRAST TECHNIQUE: Multidetector CT imaging of the head, cervical spine, and maxillofacial structures were performed using the standard protocol without intravenous contrast. Multiplanar CT image reconstructions of the cervical spine and maxillofacial structures were also generated. COMPARISON:  None. FINDINGS: CT HEAD FINDINGS No evidence for acute infarction, hemorrhage, mass lesion, hydrocephalus, or extra-axial fluid. No atrophy or white matter disease. Intact calvarium. No acute sinus or mastoid disease. CT MAXILLOFACIAL FINDINGS There is no visible facial fracture.  TMJs are located. The RIGHT first mandibular molar is nearly completely destroyed by dental caries. Significant periapical lucency associated with this tooth also present. No definite facial fracture. Similar dental caries affects the LEFT mandibular second premolar,  also with periapical lucency. Other small areas of dental caries noted bilaterally. No definite tooth fracture. No paranasal sinus fluid of significance, or blowout fracture. CT CERVICAL SPINE FINDINGS There is no visible cervical spine fracture, traumatic subluxation, prevertebral soft tissue swelling, or intraspinal hematoma. Normal intervertebral disc spaces. No worrisome osseous lesion. No neck masses. Airway midline. No pneumothorax. IMPRESSION: Negative CT head. Negative CT cervical spine. No facial fracture. Severe dental caries and periapical lucency affects the RIGHT  first molar; correlate clinically as a cause for right-sided jaw pain. Electronically Signed   By: Elsie Stain M.D.   On: 11/21/2015 16:01   Ct Cervical Spine Wo Contrast  11/21/2015  CLINICAL DATA:  Patient reports falling on face after becoming intoxicated. Difficulty eating with a right-sided jaw pain. EXAM: CT HEAD WITHOUT CONTRAST CT MAXILLOFACIAL WITHOUT CONTRAST CT CERVICAL SPINE WITHOUT CONTRAST TECHNIQUE: Multidetector CT imaging of the head, cervical spine, and maxillofacial structures were performed using the standard protocol without intravenous contrast. Multiplanar CT image reconstructions of the cervical spine and maxillofacial structures were also generated. COMPARISON:  None. FINDINGS: CT HEAD FINDINGS No evidence for acute infarction, hemorrhage, mass lesion, hydrocephalus, or extra-axial fluid. No atrophy or white matter disease. Intact calvarium. No acute sinus or mastoid disease. CT MAXILLOFACIAL FINDINGS There is no visible facial fracture.  TMJs are located. The RIGHT first mandibular molar is nearly completely destroyed by dental caries. Significant periapical lucency associated with this tooth also present. No definite facial fracture. Similar dental caries affects the LEFT mandibular second premolar, also with periapical lucency. Other small areas of dental caries noted bilaterally. No definite tooth fracture. No paranasal sinus fluid of significance, or blowout fracture. CT CERVICAL SPINE FINDINGS There is no visible cervical spine fracture, traumatic subluxation, prevertebral soft tissue swelling, or intraspinal hematoma. Normal intervertebral disc spaces. No worrisome osseous lesion. No neck masses. Airway midline. No pneumothorax. IMPRESSION: Negative CT head. Negative CT cervical spine. No facial fracture. Severe dental caries and periapical lucency affects the RIGHT first molar; correlate clinically as a cause for right-sided jaw pain. Electronically Signed   By: Elsie Stain  M.D.   On: 11/21/2015 16:01   Ct Maxillofacial Wo Cm  11/21/2015  CLINICAL DATA:  Patient reports falling on face after becoming intoxicated. Difficulty eating with a right-sided jaw pain. EXAM: CT HEAD WITHOUT CONTRAST CT MAXILLOFACIAL WITHOUT CONTRAST CT CERVICAL SPINE WITHOUT CONTRAST TECHNIQUE: Multidetector CT imaging of the head, cervical spine, and maxillofacial structures were performed using the standard protocol without intravenous contrast. Multiplanar CT image reconstructions of the cervical spine and maxillofacial structures were also generated. COMPARISON:  None. FINDINGS: CT HEAD FINDINGS No evidence for acute infarction, hemorrhage, mass lesion, hydrocephalus, or extra-axial fluid. No atrophy or white matter disease. Intact calvarium. No acute sinus or mastoid disease. CT MAXILLOFACIAL FINDINGS There is no visible facial fracture.  TMJs are located. The RIGHT first mandibular molar is nearly completely destroyed by dental caries. Significant periapical lucency associated with this tooth also present. No definite facial fracture. Similar dental caries affects the LEFT mandibular second premolar, also with periapical lucency. Other small areas of dental caries noted bilaterally. No definite tooth fracture. No paranasal sinus fluid of significance, or blowout fracture. CT CERVICAL SPINE FINDINGS There is no visible cervical spine fracture, traumatic subluxation, prevertebral soft tissue swelling, or intraspinal hematoma. Normal intervertebral disc spaces. No worrisome osseous lesion. No neck masses. Airway midline. No pneumothorax. IMPRESSION: Negative CT head. Negative CT cervical spine. No facial fracture. Severe dental caries and  periapical lucency affects the RIGHT first molar; correlate clinically as a cause for right-sided jaw pain. Electronically Signed   By: Elsie StainJohn T Curnes M.D.   On: 11/21/2015 16:01     I have personally reviewed and evaluated these images and lab results as part of my  medical decision-making.   EKG Interpretation None      MDM   Final diagnoses:  Fall  Facial pain  Headache, unspecified headache type    30 year old male presents with headache, jaw pain, and neck pain after falling 2 days ago and hitting his head. He denies LOC, though states he does not really remember the event well. He reports associated dizziness and lightheadedness. He denies vision changes. He states he was intoxicated when this occurred.   Patient is afebrile. Vital signs stable. Head normocephalic and atraumatic, though patient has tenderness to palpation of his left posterior head. No hematoma or palpable deformity. Diffuse TTP of frontal facial bones and left jaw. Patient able to open his mouth without difficulty. TTP of cervical paraspinal muscles bilaterally. No midline tenderness, palpable step-off, or deformity. Normal neuro exam with no focal deficit.  Will obtain CT head, cervical spine, and maxillofacial. Patient given tylenol and ice for pain.  Head CT negative for infarction, hemorrhage, mass lesion, hydrocephalus, extra-axial fluid, sinus or mastoid disease.  Cervical spine CT negative for fracture, subluxation, prevertebral soft tissue swelling, spinal hematoma, osseous lesions, neck masses. Maxillofacial CT negative for facial fracture; reveals severe dental caries and periapical lucency to the right first molar.  Patient denies right-sided jaw pain and has no tenderness to palpation of his right first molar.  Patient is nontoxic and well-appearing, feel he is stable for discharge at this time. Advised to take tylenol and ibuprofen for pain. Patient to follow-up PCP. Return precautions discussed. Patient verbalizes his understanding and is in agreement with plan.  BP 119/77 mmHg  Pulse 57  Temp(Src) 98.6 F (37 C) (Oral)  Resp 17  SpO2 100%  I personally performed the services described in this documentation, which was scribed in my presence. The recorded  information has been reviewed and is accurate.    Mady Gemmalizabeth C Westfall, PA-C 11/21/15 1628  Eber HongBrian Miller, MD 11/22/15 [phone]

## 2015-11-21 NOTE — ED Notes (Signed)
Declined W/C at D/C and was escorted to lobby by RN. 

## 2015-11-21 NOTE — ED Notes (Signed)
Patient transported to CT 

## 2015-11-21 NOTE — ED Notes (Signed)
Pt reports falling yesterday and injuring left jaw and head. Denies loc.

## 2015-11-21 NOTE — ED Notes (Signed)
PT reports he was drinking several days ago and fell on his face, and now has jaw pain / neck pain . Pt reports having a hard time eating . Pt also reports he hit the back of his head. Pt last drank on Sunday.

## 2015-11-21 NOTE — Discharge Instructions (Signed)
1. Medications: take tylenol or ibuprofen for pain, usual home medications 2. Treatment: rest, drink plenty of fluids 3. Follow Up: please followup with your primary doctor for discussion of your diagnoses and further evaluation after today's visit; if you do not have a primary care doctor use the resource guide provided to find one; please return to the ER for increased pain, numbness, weakness, new or worsening symptoms   Emergency Department Resource Guide 1) Find a Doctor and Pay Out of Pocket Although you won't have to find out who is covered by your insurance plan, it is a good idea to ask around and get recommendations. You will then need to call the office and see if the doctor you have chosen will accept you as a new patient and what types of options they offer for patients who are self-pay. Some doctors offer discounts or will set up payment plans for their patients who do not have insurance, but you will need to ask so you aren't surprised when you get to your appointment.  2) Contact Your Local Health Department Not all health departments have doctors that can see patients for sick visits, but many do, so it is worth a call to see if yours does. If you don't know where your local health department is, you can check in your phone book. The CDC also has a tool to help you locate your state's health department, and many state websites also have listings of all of their local health departments.  3) Find a Walk-in Clinic If your illness is not likely to be very severe or complicated, you may want to try a walk in clinic. These are popping up all over the country in pharmacies, drugstores, and shopping centers. They're usually staffed by nurse practitioners or physician assistants that have been trained to treat common illnesses and complaints. They're usually fairly quick and inexpensive. However, if you have serious medical issues or chronic medical problems, these are probably not your best  option.  No Primary Care Doctor: - Call Health Connect at  [phone] - they can help you locate a primary care doctor that  accepts your insurance, provides certain services, etc. - Physician Referral Service- [phone]  Chronic Pain Problems: Organization         Address  Phone   Notes  [facility]  [phone] Patients need to be referred by their primary care doctor.   Medication Assistance: Organization         Address  Phone   Notes  [facility] Medication [facility] [address] [place], [place] [address] [phone] --Must be a resident of [facility] [place] [place] -- Must have NO insurance coverage whatsoever (no Medicaid/ Medicare, etc.) -- The pt. MUST have a primary care doctor that directs their care regularly and follows them in the community   MedAssist  [phone]   [employer]  [phone]    Agencies that provide inexpensive medical care: Organization         Address  Phone   Notes  [facility]  [phone]   [facility]    [phone]   [facility] [address] [place], [place] [address] [phone]   [facility] [address], [place] [phone]   Planned Parenthood    [phone]   [facility]    [phone]   Community Health and [facility]  [address], [place] Phone:  [phone], Fax:  [phone] Hours of Operation:  9 am - 6 pm, M-F.  Also accepts Medicaid/Medicare and self-pay.  [facility] [place], [address], [place] Phone: [phone], Fax: [phone]. Hours of Operation:  8:30 am - 5:30 pm, M-F.  Also accepts Medicaid and self-pay.  [facility] [place] [address], [place] Phone: [phone]   [place], [place], [place] [phone], Ext. [phone] Mondays & Thursdays: 7-9 AM.  First 15  patients are seen on a first come, first serve basis.    [place] Providers:  Organization         Address  Phone   Notes  [facility] [address], [place] [phone] Also accepts self-pay patients.  [facility] [address], [place]  [phone]   [place], [address], [place] [phone]   [facility] [address] [place] [phone]   Lucianne Lei [address], [place]   [phone] Only accepts [place] Access [place] patients after they have their name applied to their card.   Self-Pay (no insurance) in [facility]:  Organization         Address  Phone   Notes  Sickle Cell Patients, [facility] [place] [phone]   [facility] [place] [phone]   [facility] [place]  [place], [place], [place] [phone]   [facility]/Dr. Osei-Bonsu  [address], [place] [address], [place] [phone] Phone number for both [place] and [place] locations is the same.  Urgent Medical and [facility] [address] [place] [phone]   [facility] [address], [place] or [address] [phone]   [facility] [address] [place] [phone], phone; [phone], fax Sees patients 1st and 3rd Saturday of every month.  Must not qualify for public or private insurance (i.e. Medicaid, Medicare, [place] Health Choice, Veterans' Benefits)  Household income should be no more than 200% of the poverty level The clinic cannot treat you if you are pregnant or think you are pregnant  Sexually transmitted diseases are not treated at the clinic.    Dental  Care: Organization         Address  Phone  Notes  [facility] Department of [facility] [place] [phone] Accepts children up to age 61 who are enrolled in [place] or [employer]; pregnant women with a Medicaid card; and children who have applied for Medicaid or [place] Health Choice, but were declined, whose parents can pay a reduced fee at time of service.  [facility] Department of [facility]  [address], [place] [phone] Accepts children up to age 47 who are enrolled in [place] or [place]; pregnant women with a Medicaid card; and children who have applied for Medicaid or [place] Health Choice, but were declined, whose parents can pay a reduced fee at time of service.  [facility] Adult Dental Access PROGRAM  [place], [place] [phone]  Patients are seen by appointment only. Walk-ins are not accepted. [place] will see patients 1 years of age and older. Monday - Tuesday (8am-5pm) Most Wednesdays (8:30-5pm) $30 per visit, cash only  [facility] Adult Dental Access PROGRAM  [address], [facility] [phone] Patients are seen by appointment only. Walk-ins are not accepted. [place] will see patients 28 years of age and older. One Wednesday Evening (Monthly: Volunteer Based).  $30 per visit, cash only  [place]  [phone] for adults; Children under age 81, call Graduate Pediatric Dentistry at [phone]. Children aged 82-14, please call [phone] to request a pediatric application.  Dental services are provided in all areas of dental care including fillings, crowns and bridges, complete and partial dentures, implants, gum treatment, root canals, and extractions. Preventive care is also provided. Treatment is provided to both adults and children. Patients are selected via a lottery and there is often a waiting list.   [facility] [address] [place]  [phone] [email or web address]   [facility] [address] [place], [place] [phone], Ext. [phone] Second and Fourth Thursday of each month, opens at 6:30 AM; Clinic ends at 9 AM.  Patients are seen on a first-come first-served basis, and a limited number are seen during each clinic.   [facility]  [address] Hillard Danker [place], [place] [phone]   Eligibility Requirements You must have lived in [place], [place], or [place] counties for at least the last three months.   You cannot be eligible for state or federal sponsored [employer], including [employer], [place], or [employer].   You generally cannot be eligible for healthcare insurance through your employer.    How to apply: Eligibility screenings are held every Tuesday and Wednesday afternoon from 1:00 pm until 4:00 pm. You do not need an appointment for the interview!  [facility] [address] [place], [place]   [place]  [place] Department  [place]  [phone]    Behavioral Health Resources in the Community: Intensive Outpatient Programs Organization         Address  Phone  Notes  [place]. [address], [place], [place] [phone]   [facility] Outpatient [address] [place], [place]   ADS: Alcohol & Drug Svcs [address], [place], [place]   [place] [address],  [place], [place] or [phone]   Substance Abuse Resources Organization         Address  Phone  Notes  Alcohol and Drug Services  [phone]   [place]  [phone]   The [place]   Chinita Pester  [phone]   Residential & Outpatient Substance Abuse Program  [phone]    Psychological Services Organization         Address  Phone  Notes  [facility] [place]  [place]  [phone]   [place] [address], [place] or [phone]    Mobile Crisis Teams Organization         Address  Phone  Notes  [facility], Mobile Crisis Care Unit  [phone]   [facility]  [address] [employer], [place]   [facility] [address] [place]  [address] [place] [phone]    Self-Help/Support Groups Organization         Address  Phone             Notes  [facility]ental Health Assoc. of [place] - variety of support groups  [phone]- [phone] Call for more information  Narcotics Anonymous (NA), Caring Services [address], [employer]  2 meetings at this location   Statistician         Address  Phone  Notes  [facility] [address],    [place]  [phone]   [facility]  [address] [place] [address], [place], [place] [phone]   [facility] Treatment [facility] [address] [place], [place] [phone] Admissions: 8am-3pm M-F  [facility] [address],    [place], [place] [phone]   [facility] [address] [place], [place], [place] [phone]   [facility] [address]  [place], [place] [phone]   [facility] - Intensive Outpatient [address], [place], [place] [phone]   [facility] (Addiction Recovery Care [facility] [address] [place]  [place], [place] [phone] or [phone]   [facility] (RTS) [address], [place], [place] [phone] Accepts Medicaid  [facility] [place] [address]  [place] [phone] Substance Abuse/Addiction Treatment   [facility] Organization         Address  Phone  Notes  [facility]  [phone]   Angie Fava, PhD [address] Ervin Knack [place], [place]   [phone] or [phone]   [facility] Behavioral   [address] [place], [place] [phone]   [facility] [address] [place], [place] [phone] Insurance/Medicaid/sponsorship through [facility] and Families [address]                                    [place], [place] [phone] Therapy/tele-psych/case  [facility] [address], [place] [phone]    Dr. Lolly Mustache  [phone]   [facility]  United Way [facility] Dept. 1) [address] [place] 2) [address], [place] 3)  [address], [place] [phone]  [phone]   [facility] Child Abuse Hotline 516-018-9453 or [phone] (After Hours)

## 2015-12-19 ENCOUNTER — Telehealth: Payer: Self-pay | Admitting: Clinical

## 2015-12-19 NOTE — Telephone Encounter (Signed)
Pt mother, Georga Hacking calls, expressing concern that her son, Jasdeep Kepner, has been diagnosed with schizophrenia, but is not taking his medications. Ms. Stephannie Peters is told that, because of HIPPA, I am unable to give out patient information, but that she is welcome to let [facility] know any pertinent information about her son. She says he was [facility] to [facility], but is not currently receiving [facility] care. Ms. Stephannie Peters is given contact information to Mobile Crisis line [phone] for mobile crisis, as needed, and [employer] [phone], to have PATH team to be on the lookout for Mr. Cousineau, should he appear at [facility] It is suggested that mom can have son involuntarily committed for [facility] evaluation, if needed, and that [facility] will also be on the alert, if he should appear at [facility]. Ms. Stephannie Peters states she understands and will think about IVC for son.

## 2016-03-04 ENCOUNTER — Encounter (HOSPITAL_COMMUNITY): Payer: Self-pay | Admitting: Emergency Medicine

## 2016-03-04 ENCOUNTER — Emergency Department (HOSPITAL_COMMUNITY)
Admission: EM | Admit: 2016-03-04 | Discharge: 2016-03-04 | Disposition: A | Discharge status: Home / Self Care | Payer: Medicaid Other | Attending: Emergency Medicine | Admitting: Emergency Medicine

## 2016-03-04 DIAGNOSIS — M79662 Pain in left lower leg: Secondary | ICD-10-CM | POA: Insufficient documentation

## 2016-03-04 DIAGNOSIS — Z79899 Other long term (current) drug therapy: Secondary | ICD-10-CM | POA: Diagnosis not present

## 2016-03-04 DIAGNOSIS — R109 Unspecified abdominal pain: Secondary | ICD-10-CM | POA: Diagnosis not present

## 2016-03-04 DIAGNOSIS — F17210 Nicotine dependence, cigarettes, uncomplicated: Secondary | ICD-10-CM | POA: Insufficient documentation

## 2016-03-04 DIAGNOSIS — M79605 Pain in left leg: Secondary | ICD-10-CM

## 2016-03-04 LAB — URINALYSIS, ROUTINE W REFLEX MICROSCOPIC
BILIRUBIN URINE: NEGATIVE
GLUCOSE, UA: NEGATIVE mg/dL
Hgb urine dipstick: NEGATIVE
KETONES UR: NEGATIVE mg/dL
NITRITE: NEGATIVE
PH: 7 (ref 5.0–8.0)
PROTEIN: NEGATIVE mg/dL
Specific Gravity, Urine: 1.02 (ref 1.005–1.030)

## 2016-03-04 LAB — COMPREHENSIVE METABOLIC PANEL
ALBUMIN: 3.7 g/dL (ref 3.5–5.0)
ALK PHOS: 64 U/L (ref 38–126)
ALT: 13 U/L — AB (ref 17–63)
AST: 19 U/L (ref 15–41)
Anion gap: 7 (ref 5–15)
BILIRUBIN TOTAL: 0.5 mg/dL (ref 0.3–1.2)
BUN: 14 mg/dL (ref 6–20)
CO2: 27 mmol/L (ref 22–32)
CREATININE: 0.96 mg/dL (ref 0.61–1.24)
Calcium: 8.9 mg/dL (ref 8.9–10.3)
Chloride: 104 mmol/L (ref 101–111)
GFR calc Af Amer: >60 mL/min (ref >60)
GLUCOSE: 91 mg/dL (ref 65–99)
Potassium: 3.8 mmol/L (ref 3.5–5.1)
Sodium: 138 mmol/L (ref 135–145)
TOTAL PROTEIN: 6.4 g/dL — AB (ref 6.5–8.1)

## 2016-03-04 LAB — CBC
HCT: 37.9 % — ABNORMAL LOW (ref 39.0–52.0)
Hemoglobin: 13 g/dL (ref 13.0–17.0)
MCH: 33.9 pg (ref 26.0–34.0)
MCHC: 34.3 g/dL (ref 30.0–36.0)
MCV: 98.7 fL (ref 78.0–100.0)
PLATELETS: 203 10*3/uL (ref 150–400)
RBC: 3.84 MIL/uL — ABNORMAL LOW (ref 4.22–5.81)
RDW: 12.4 % (ref 11.5–15.5)
WBC: 7.5 10*3/uL (ref 4.0–10.5)

## 2016-03-04 LAB — URINE MICROSCOPIC-ADD ON

## 2016-03-04 NOTE — ED Notes (Signed)
Patient left at this time with all belongings, escorted to lobby to wait for bus.
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
## 2016-03-04 NOTE — Discharge Instructions (Signed)
Muscle Pain, Adult Jerry [name], see a primary care doctor within 3 days for close follow up. Take tylenol or ibuprofen for pain. If symptoms worsen, come back to the ED immediately. Thank you. Muscle pain (myalgia) may be caused by many things, including:  Overuse or muscle strain, especially if you are not in shape. This is the most common cause of muscle pain.  Injury.  Bruises.  Viruses, such as the flu.  Infectious diseases.  Fibromyalgia, which is a chronic condition that causes muscle tenderness, fatigue, and headache.  Autoimmune diseases, including lupus.  Certain drugs, including ACE inhibitors and statins. Muscle pain may be mild or severe. In most cases, the pain lasts only a short time and goes away without treatment. To diagnose the cause of your muscle pain, your health care provider will take your medical history. This means he or she will ask you when your muscle pain began and what has been happening. If you have not had muscle pain for very long, your health care provider may want to wait before doing much testing. If your muscle pain has lasted a long time, your health care provider may want to run tests right away. If your health care provider thinks your muscle pain may be caused by illness, you may need to have additional tests to rule out certain conditions.  Treatment for muscle pain depends on the cause. Home care is often enough to relieve muscle pain. Your health care provider may also prescribe anti-inflammatory medicine. HOME CARE INSTRUCTIONS Watch your condition for any changes. The following actions may help to lessen any discomfort you are feeling:  Only take over-the-counter or prescription medicines as directed by your health care provider.  Apply ice to the sore muscle:  Put ice in a plastic bag.  Place a towel between your skin and the bag.  Leave the ice on for 15-20 minutes, 3-4 times a day.  You may alternate applying hot and cold packs to the  muscle as directed by your health care provider.  If overuse is causing your muscle pain, slow down your activities until the pain goes away.  Remember that it is normal to feel some muscle pain after starting a workout program. Muscles that have not been used often will be sore at first.  Do regular, gentle exercises if you are not usually active.  Warm up before exercising to lower your risk of muscle pain.  Do not continue working out if the pain is very bad. Bad pain could mean you have injured a muscle. SEEK MEDICAL CARE IF:  Your muscle pain gets worse, and medicines do not help.  You have muscle pain that lasts longer than 3 days.  You have a rash or fever along with muscle pain.  You have muscle pain after a tick bite.  You have muscle pain while working out, even though you are in good physical condition.  You have redness, soreness, or swelling along with muscle pain.  You have muscle pain after starting a new medicine or changing the dose of a medicine. SEEK IMMEDIATE MEDICAL CARE IF:  You have trouble breathing.  You have trouble swallowing.  You have muscle pain along with a stiff neck, fever, and vomiting.  You have severe muscle weakness or cannot move part of your body. MAKE SURE YOU:   Understand these instructions.  Will watch your condition.  Will get help right away if you are not doing well or get worse.   This information  is not intended to replace advice given to you by your health care provider. Make sure you discuss any questions you have with your health care provider.   Document Released: 09/27/2006 Document Revised: 11/26/2014 Document Reviewed: 09/01/2013 Elsevier Interactive Patient Education [employer]2016 Elsevier Inc.

## 2016-03-04 NOTE — ED Provider Notes (Signed)
CSN: [id number]     Arrival date & time 03/04/16  0133 History   By signing my name below, I, Arlan Organ, attest that this documentation has been prepared under the direction and in the presence of Tomasita Crumble, MD.  Electronically Signed: Arlan Organ, ED Scribe. 03/04/2016. 3:55 AM.   Chief Complaint  Patient presents with  . Leg Pain  . Abdominal Pain   HPI  HPI Comments: Jerry [name] is a 30 y.o. male without any pertinent past medical history who presents to the Emergency Department complaining of constant, ongoing L leg swelling and pain x 3-4 days. No recent injury, trauma, or falls. Discomfort is exacerbated with pressure to area. No alleviating factors at this time. No OTC medications or home remedies attempted prior to arrival. No recent fever or chills.  PCP: [facility]    History reviewed. No pertinent past medical history. Past Surgical History  Procedure Laterality Date  . Leg surgery Right   . Laparotomy N/A 10/02/2015    Procedure: DIAGNOSTIC LAPAROSCOPY, WASHOUT OF WOUND ;  Surgeon: Axel Filler, MD;  Location: [facility] OR;  Service: General;  Laterality: N/A;   Family History  Problem Relation Age of Onset  . Arthritis Mother   . Heart Problems Father    Social History  Substance Use Topics  . Smoking status: Current Every [name] Smoker -- 1.00 packs/[name] for 20 years    Types: Cigarettes  . Smokeless tobacco: None  . Alcohol Use: Yes    Review of Systems  A complete 10 system review of systems was obtained and all systems are negative except as noted in the HPI and PMH.    Allergies  Cogentin and Risperdal  Home Medications   Prior to Admission medications   Medication Sig Start Date End Date Taking? Authorizing Provider  ARIPiprazole (ABILIFY) 15 MG tablet Take 0.5 tablets (7.5 mg total) by mouth daily. Patient not taking: Reported on 10/11/2015 03/15/15   Thermon Leyland, NP  ARIPiprazole 400 MG SUSR Inject 400 mg into the muscle every 30  (thirty) days. First dose received on March 15, 2015, next dose due in thirty days on Apr 14, 2015 Patient not taking: Reported on 10/11/2015 04/14/15   Thermon Leyland, NP  benztropine (COGENTIN) 0.5 MG tablet Take 1 tablet (0.5 mg total) by mouth daily. Patient not taking: Reported on 10/11/2015 03/15/15   Thermon Leyland, NP  divalproex (DEPAKOTE ER) 500 MG 24 hr tablet Take 1 tablet (500 mg total) by mouth at bedtime. Patient not taking: Reported on 10/11/2015 03/15/15   Thermon Leyland, NP  HYDROcodone-acetaminophen (NORCO) 5-325 MG tablet Take 1 tablet by mouth every 4 (four) hours as needed for moderate pain or severe pain. 10/11/15   Leta Baptist, MD  naproxen (NAPROSYN) 500 MG tablet Take 1 tablet (500 mg total) by mouth 2 (two) times daily with a meal. Patient not taking: Reported on 10/11/2015 10/03/15   Freeman Caldron, PA-C  nicotine (NICODERM CQ - DOSED IN MG/24 HOURS) 21 mg/24hr patch Place 1 patch (21 mg total) onto the skin daily. 03/15/15   Thermon Leyland, NP  traZODone (DESYREL) 100 MG tablet Take 1 tablet (100 mg total) by mouth at bedtime as needed for sleep (May repeat X1). Patient not taking: Reported on 10/11/2015 03/15/15   Thermon Leyland, NP   Triage Vitals: BP 114/80 mmHg  Pulse 64  Temp(Src) 98.1 F (36.7 C) (Oral)  Resp 18  Ht 6' (1.829  m)  Wt 199 lb (90.266 kg)  BMI 26.98 kg/m2  SpO2 98%   Physical Exam  Constitutional: He is oriented to person, place, and time. Vital signs are normal. He appears well-developed and well-nourished.  Non-toxic appearance. He does not appear ill. No distress.  HENT:  Head: Normocephalic and atraumatic.  Nose: Nose normal.  Mouth/Throat: Oropharynx is clear and moist. No oropharyngeal exudate.  Eyes: Conjunctivae and EOM are normal. Pupils are equal, round, and reactive to light. No scleral icterus.  Neck: Normal range of motion. Neck supple. No tracheal deviation, no edema, no erythema and normal range of motion present. No thyroid  mass and no thyromegaly present.  Cardiovascular: Normal rate, regular rhythm, S1 normal, S2 normal, normal heart sounds, intact distal pulses and normal pulses.  Exam reveals no gallop and no friction rub.   No murmur heard. Pulmonary/Chest: Effort normal and breath sounds normal. No respiratory distress. He has no wheezes. He has no rhonchi. He has no rales.  Abdominal: Soft. Normal appearance and bowel sounds are normal. He exhibits no distension, no ascites and no mass. There is no hepatosplenomegaly. There is no tenderness. There is no rebound, no guarding and no CVA tenderness.  Musculoskeletal: Normal range of motion. He exhibits no edema or tenderness.  Lymphadenopathy:    He has no cervical adenopathy.  Neurological: He is alert and oriented to person, place, and time. He has normal strength. No cranial nerve deficit or sensory deficit.  Skin: Skin is warm, dry and intact. No petechiae and no rash noted. He is not diaphoretic. No erythema. No pallor.  Psychiatric:  Patient has rapid flight of ideas   Nursing note and vitals reviewed.   ED Course  Procedures (including critical care time)  DIAGNOSTIC STUDIES: Oxygen Saturation is 98% on RA, Normal by my interpretation.    COORDINATION OF CARE: 3:37 AM- Will order blood work and urinalysis. Discussed treatment plan with pt at bedside and pt agreed to plan.     Labs Review Labs Reviewed  COMPREHENSIVE METABOLIC PANEL - Abnormal; Notable for the following:    Total Protein 6.4 (*)    ALT 13 (*)    All other components within normal limits  CBC - Abnormal; Notable for the following:    RBC 3.84 (*)    HCT 37.9 (*)    All other components within normal limits  URINALYSIS, ROUTINE W REFLEX MICROSCOPIC (NOT AT [facility]) - Abnormal; Notable for the following:    APPearance CLOUDY (*)    Leukocytes, UA SMALL (*)    All other components within normal limits  URINE MICROSCOPIC-ADD ON - Abnormal; Notable for the following:    Squamous  Epithelial / LPF 0-5 (*)    Bacteria, UA FEW (*)    All other components within normal limits    Imaging Review No results found. I have personally reviewed and evaluated these images and lab results as part of my medical decision-making.   EKG Interpretation None      MDM   Final diagnoses:  None    Patient has rapid flight of ideas and is speaking to other people in the room.  He has no tenderness on exam.  Normal pulses and normal ROM of his leg.  He has no SI/HI. I attempted to explain to him that he does not need an xray for his leg pain.  Rec to take tylenol or ibuprofen for pain and see aprimary care doctor.  He appears well and in NAD.  VS remain within his normal limits and he is safe for DC.   I personally performed the services described in this documentation, which was scribed in my presence. The recorded information has been reviewed and is accurate.     Tomasita Crumble, MD 03/04/16 [phone]

## 2016-03-04 NOTE — ED Notes (Signed)
Escorted patient to treatment room, pt slow to respond by name and follow commands, remains alert however mumbling about people who treat him poorly, stating "you do what you do." Difficult to understand d/t mumbling.

## 2016-03-10 ENCOUNTER — Emergency Department (HOSPITAL_COMMUNITY): Payer: Medicaid Other

## 2016-03-10 ENCOUNTER — Other Ambulatory Visit: Payer: Self-pay

## 2016-03-10 ENCOUNTER — Encounter (HOSPITAL_COMMUNITY): Payer: Self-pay | Admitting: Radiology

## 2016-03-10 ENCOUNTER — Other Ambulatory Visit: Payer: Self-pay | Admitting: Emergency Medicine

## 2016-03-10 ENCOUNTER — Emergency Department (HOSPITAL_COMMUNITY)
Admission: EM | Admit: 2016-03-10 | Discharge: 2016-03-11 | Disposition: A | Discharge status: Other Acute Inpatient Hospital | Payer: Medicaid Other | Attending: Emergency Medicine | Admitting: Emergency Medicine

## 2016-03-10 ENCOUNTER — Encounter (HOSPITAL_COMMUNITY)
Admission: EM | Disposition: A | Discharge status: Other Acute Inpatient Hospital | Payer: Self-pay | Source: Home / Self Care | Attending: Emergency Medicine

## 2016-03-10 DIAGNOSIS — Y9289 Other specified places as the place of occurrence of the external cause: Secondary | ICD-10-CM | POA: Diagnosis not present

## 2016-03-10 DIAGNOSIS — R000 Tachycardia, unspecified: Secondary | ICD-10-CM | POA: Insufficient documentation

## 2016-03-10 DIAGNOSIS — T2602XA Burn of left eyelid and periocular area, initial encounter: Secondary | ICD-10-CM | POA: Insufficient documentation

## 2016-03-10 DIAGNOSIS — F17200 Nicotine dependence, unspecified, uncomplicated: Secondary | ICD-10-CM | POA: Diagnosis not present

## 2016-03-10 DIAGNOSIS — W3409XA Accidental discharge from other specified firearms, initial encounter: Secondary | ICD-10-CM | POA: Insufficient documentation

## 2016-03-10 DIAGNOSIS — F419 Anxiety disorder, unspecified: Secondary | ICD-10-CM | POA: Diagnosis not present

## 2016-03-10 DIAGNOSIS — Y998 Other external cause status: Secondary | ICD-10-CM | POA: Diagnosis not present

## 2016-03-10 DIAGNOSIS — Y9389 Activity, other specified: Secondary | ICD-10-CM | POA: Diagnosis not present

## 2016-03-10 DIAGNOSIS — T2642XA Burn of left eye and adnexa, part unspecified, initial encounter: Secondary | ICD-10-CM

## 2016-03-10 DIAGNOSIS — Z23 Encounter for immunization: Secondary | ICD-10-CM | POA: Insufficient documentation

## 2016-03-10 LAB — COMPREHENSIVE METABOLIC PANEL
ALK PHOS: 60 U/L (ref 38–126)
ALT: 13 U/L — AB (ref 17–63)
AST: 20 U/L (ref 15–41)
Albumin: 3.4 g/dL — ABNORMAL LOW (ref 3.5–5.0)
Anion gap: 9 (ref 5–15)
BUN: 16 mg/dL (ref 6–20)
CALCIUM: 8.6 mg/dL — AB (ref 8.9–10.3)
CHLORIDE: 108 mmol/L (ref 101–111)
CO2: 25 mmol/L (ref 22–32)
CREATININE: 1.2 mg/dL (ref 0.61–1.24)
GFR calc Af Amer: >60 mL/min (ref >60)
GFR calc non Af Amer: >60 mL/min (ref >60)
GLUCOSE: 154 mg/dL — AB (ref 65–99)
Potassium: 3.1 mmol/L — ABNORMAL LOW (ref 3.5–5.1)
SODIUM: 142 mmol/L (ref 135–145)
Total Bilirubin: 0.5 mg/dL (ref 0.3–1.2)
Total Protein: 6.6 g/dL (ref 6.5–8.1)

## 2016-03-10 LAB — CBC
HCT: 37.1 % — ABNORMAL LOW (ref 39.0–52.0)
Hemoglobin: 11.4 g/dL — ABNORMAL LOW (ref 13.0–17.0)
MCH: 31.8 pg (ref 26.0–34.0)
MCHC: 30.7 g/dL (ref 30.0–36.0)
MCV: 103.3 fL — AB (ref 78.0–100.0)
PLATELETS: 172 10*3/uL (ref 150–400)
RBC: 3.59 MIL/uL — ABNORMAL LOW (ref 4.22–5.81)
RDW: 12.9 % (ref 11.5–15.5)
WBC: 11 10*3/uL — ABNORMAL HIGH (ref 4.0–10.5)

## 2016-03-10 LAB — PROTIME-INR
INR: 1.14 (ref 0.00–1.49)
Prothrombin Time: 14.8 seconds (ref 11.6–15.2)

## 2016-03-10 LAB — ETHANOL

## 2016-03-10 SURGERY — ENUCLEATION, EYE
Anesthesia: General | Laterality: Left

## 2016-03-10 MED ORDER — MORPHINE SULFATE (PF) 2 MG/ML IV SOLN
INTRAVENOUS | Status: AC
  Administered 2016-03-11: 4 mg via INTRAVENOUS
  Filled 2016-03-10: qty 1

## 2016-03-10 MED ORDER — TETANUS-DIPHTHERIA TOXOIDS TD 5-2 LFU IM INJ: 0.5000 mL | INJECTION | Freq: Once | INTRAMUSCULAR | Status: DC

## 2016-03-10 MED ORDER — TETANUS-DIPHTH-ACELL PERTUSSIS 5-2.5-18.5 LF-MCG/0.5 IM SUSP
INTRAMUSCULAR | Status: AC
  Administered 2016-03-10: 0.5 mL via INTRAMUSCULAR
  Filled 2016-03-10: qty 0.5

## 2016-03-10 MED ORDER — ONDANSETRON HCL 4 MG/2ML IJ SOLN
4.0000 mg | Freq: Once | INTRAMUSCULAR | Status: AC
  Administered 2016-03-10: 4 mg via INTRAVENOUS

## 2016-03-10 MED ORDER — MORPHINE SULFATE (PF) 2 MG/ML IV SOLN
2.0000 mg | Freq: Once | INTRAVENOUS | Status: AC
  Administered 2016-03-10: 2 mg via INTRAVENOUS
  Administered 2016-03-11: 4 mg via INTRAVENOUS

## 2016-03-10 MED ORDER — ONDANSETRON HCL 4 MG/2ML IJ SOLN
INTRAMUSCULAR | Status: AC
  Filled 2016-03-10: qty 2
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
## 2016-03-11 DIAGNOSIS — Y9389 Activity, other specified: Secondary | ICD-10-CM | POA: Diagnosis not present

## 2016-03-11 DIAGNOSIS — F419 Anxiety disorder, unspecified: Secondary | ICD-10-CM | POA: Diagnosis not present

## 2016-03-11 DIAGNOSIS — F17200 Nicotine dependence, unspecified, uncomplicated: Secondary | ICD-10-CM | POA: Diagnosis not present

## 2016-03-11 DIAGNOSIS — W3409XA Accidental discharge from other specified firearms, initial encounter: Secondary | ICD-10-CM | POA: Diagnosis not present

## 2016-03-11 DIAGNOSIS — T2602XA Burn of left eyelid and periocular area, initial encounter: Secondary | ICD-10-CM | POA: Diagnosis present

## 2016-03-11 DIAGNOSIS — R000 Tachycardia, unspecified: Secondary | ICD-10-CM | POA: Diagnosis not present

## 2016-03-11 DIAGNOSIS — Z23 Encounter for immunization: Secondary | ICD-10-CM | POA: Diagnosis not present

## 2016-03-11 DIAGNOSIS — Y998 Other external cause status: Secondary | ICD-10-CM | POA: Diagnosis not present

## 2016-03-11 DIAGNOSIS — Y9289 Other specified places as the place of occurrence of the external cause: Secondary | ICD-10-CM | POA: Diagnosis not present

## 2016-03-11 LAB — TYPE AND SCREEN
ABO/RH(D): O POS
Antibody Screen: NEGATIVE
UNIT DIVISION: 0
UNIT DIVISION: 0

## 2016-03-11 LAB — ABO/RH: ABO/RH(D): O POS

## 2016-03-11 LAB — PREPARE FRESH FROZEN PLASMA
Unit division: 0
Unit division: 0

## 2016-03-11 LAB — CDS SEROLOGY

## 2016-03-11 MED ORDER — MORPHINE SULFATE (PF) 4 MG/ML IV SOLN: 4.0000 mg | Freq: Once | INTRAVENOUS | Status: DC

## 2016-03-11 MED ORDER — MORPHINE SULFATE (PF) 2 MG/ML IV SOLN
INTRAVENOUS | Status: AC
  Filled 2016-03-11: qty 2

## 2016-03-11 MED ORDER — HYDROMORPHONE HCL 1 MG/ML IJ SOLN
1.0000 mg | Freq: Once | INTRAMUSCULAR | Status: AC
  Administered 2016-03-11: 1 mg via INTRAVENOUS
  Filled 2016-03-11: qty 1

## 2016-03-11 NOTE — ED Notes (Signed)
[facility] @ [phone]

## 2016-03-11 NOTE — ED Notes (Signed)
Spoke with mother [name], mother updated with pt's permission

## 2016-03-11 NOTE — ED Notes (Signed)
Call placed to Georga HackingSharon Milton, mother-no answer, message left

## 2016-03-11 NOTE — ED Notes (Signed)
Call[facility] for [facility]

## 2016-03-12 ENCOUNTER — Encounter (HOSPITAL_COMMUNITY): Payer: Self-pay | Admitting: Emergency Medicine

## 2016-03-23 ENCOUNTER — Encounter: Payer: Self-pay | Admitting: Optometry

## 2016-07-25 ENCOUNTER — Emergency Department (HOSPITAL_COMMUNITY)
Admission: EM | Admit: 2016-07-25 | Discharge: 2016-07-25 | Disposition: A | Discharge status: Home / Self Care | Payer: Medicaid Other | Attending: Dermatology | Admitting: Dermatology

## 2016-07-25 ENCOUNTER — Encounter (HOSPITAL_COMMUNITY): Payer: Self-pay | Admitting: Emergency Medicine

## 2016-07-25 DIAGNOSIS — F17210 Nicotine dependence, cigarettes, uncomplicated: Secondary | ICD-10-CM | POA: Diagnosis not present

## 2016-07-25 DIAGNOSIS — R109 Unspecified abdominal pain: Secondary | ICD-10-CM | POA: Diagnosis present

## 2016-07-25 DIAGNOSIS — Z5321 Procedure and treatment not carried out due to patient leaving prior to being seen by health care provider: Secondary | ICD-10-CM | POA: Diagnosis not present

## 2016-07-25 DIAGNOSIS — R51 Headache: Secondary | ICD-10-CM | POA: Diagnosis not present

## 2016-07-25 LAB — CBC
HCT: 40.4 % (ref 39.0–52.0)
HEMOGLOBIN: 13.7 g/dL (ref 13.0–17.0)
MCH: 34.6 pg — AB (ref 26.0–34.0)
MCHC: 33.9 g/dL (ref 30.0–36.0)
MCV: 102 fL — ABNORMAL HIGH (ref 78.0–100.0)
PLATELETS: 201 10*3/uL (ref 150–400)
RBC: 3.96 MIL/uL — AB (ref 4.22–5.81)
RDW: 12.2 % (ref 11.5–15.5)
WBC: 5.4 10*3/uL (ref 4.0–10.5)

## 2016-07-25 LAB — URINALYSIS, ROUTINE W REFLEX MICROSCOPIC
Bilirubin Urine: NEGATIVE
Glucose, UA: NEGATIVE mg/dL
HGB URINE DIPSTICK: NEGATIVE
Ketones, ur: NEGATIVE mg/dL
LEUKOCYTES UA: NEGATIVE
Nitrite: NEGATIVE
PROTEIN: NEGATIVE mg/dL
SPECIFIC GRAVITY, URINE: 1.031 — AB (ref 1.005–1.030)
pH: 5.5 (ref 5.0–8.0)

## 2016-07-25 LAB — COMPREHENSIVE METABOLIC PANEL
ALK PHOS: 67 U/L (ref 38–126)
ALT: 12 U/L — ABNORMAL LOW (ref 17–63)
ANION GAP: 6 (ref 5–15)
AST: 22 U/L (ref 15–41)
Albumin: 3.8 g/dL (ref 3.5–5.0)
BUN: 17 mg/dL (ref 6–20)
CALCIUM: 9.2 mg/dL (ref 8.9–10.3)
CHLORIDE: 106 mmol/L (ref 101–111)
CO2: 27 mmol/L (ref 22–32)
Creatinine, Ser: 1.03 mg/dL (ref 0.61–1.24)
Glucose, Bld: 90 mg/dL (ref 65–99)
Potassium: 4.4 mmol/L (ref 3.5–5.1)
SODIUM: 139 mmol/L (ref 135–145)
Total Bilirubin: 0.5 mg/dL (ref 0.3–1.2)
Total Protein: 6.3 g/dL — ABNORMAL LOW (ref 6.5–8.1)

## 2016-07-25 LAB — LIPASE, BLOOD: LIPASE: 33 U/L (ref 11–51)

## 2016-07-25 NOTE — ED Triage Notes (Signed)
Pt did not answer.

## 2016-07-25 NOTE — ED Notes (Signed)
Pt called for room no answer nurse first aware
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
## 2016-11-20 ENCOUNTER — Emergency Department (HOSPITAL_COMMUNITY)
Admission: EM | Admit: 2016-11-20 | Discharge: 2016-11-20 | Disposition: A | Discharge status: Home / Self Care | Payer: Medicaid Other | Attending: Emergency Medicine | Admitting: Emergency Medicine

## 2016-11-20 ENCOUNTER — Emergency Department (HOSPITAL_COMMUNITY): Payer: Medicaid Other

## 2016-11-20 ENCOUNTER — Encounter (HOSPITAL_COMMUNITY): Payer: Self-pay | Admitting: Radiology

## 2016-11-20 DIAGNOSIS — F17210 Nicotine dependence, cigarettes, uncomplicated: Secondary | ICD-10-CM | POA: Insufficient documentation

## 2016-11-20 DIAGNOSIS — N451 Epididymitis: Secondary | ICD-10-CM | POA: Diagnosis not present

## 2016-11-20 DIAGNOSIS — R1031 Right lower quadrant pain: Secondary | ICD-10-CM

## 2016-11-20 DIAGNOSIS — N200 Calculus of kidney: Secondary | ICD-10-CM | POA: Diagnosis not present

## 2016-11-20 DIAGNOSIS — R52 Pain, unspecified: Secondary | ICD-10-CM

## 2016-11-20 LAB — URINALYSIS, ROUTINE W REFLEX MICROSCOPIC
BACTERIA UA: NONE SEEN
Bilirubin Urine: NEGATIVE
Glucose, UA: NEGATIVE mg/dL
Ketones, ur: NEGATIVE mg/dL
Leukocytes, UA: NEGATIVE
Nitrite: NEGATIVE
PROTEIN: 30 mg/dL — AB
Specific Gravity, Urine: 1.018 (ref 1.005–1.030)
pH: 5 (ref 5.0–8.0)

## 2016-11-20 LAB — COMPREHENSIVE METABOLIC PANEL
ALBUMIN: 3.9 g/dL (ref 3.5–5.0)
ALT: 19 U/L (ref 17–63)
ANION GAP: 8 (ref 5–15)
AST: 22 U/L (ref 15–41)
Alkaline Phosphatase: 67 U/L (ref 38–126)
BUN: 12 mg/dL (ref 6–20)
CO2: 26 mmol/L (ref 22–32)
Calcium: 9 mg/dL (ref 8.9–10.3)
Chloride: 102 mmol/L (ref 101–111)
Creatinine, Ser: 1.12 mg/dL (ref 0.61–1.24)
GFR calc Af Amer: >60 mL/min (ref >60)
GFR calc non Af Amer: >60 mL/min (ref >60)
GLUCOSE: 138 mg/dL — AB (ref 65–99)
POTASSIUM: 3.5 mmol/L (ref 3.5–5.1)
SODIUM: 136 mmol/L (ref 135–145)
Total Bilirubin: 0.4 mg/dL (ref 0.3–1.2)
Total Protein: 7 g/dL (ref 6.5–8.1)

## 2016-11-20 LAB — CBC
HEMATOCRIT: 42.2 % (ref 39.0–52.0)
HEMOGLOBIN: 14.3 g/dL (ref 13.0–17.0)
MCH: 34.4 pg — AB (ref 26.0–34.0)
MCHC: 33.9 g/dL (ref 30.0–36.0)
MCV: 101.4 fL — AB (ref 78.0–100.0)
Platelets: 234 10*3/uL (ref 150–400)
RBC: 4.16 MIL/uL — ABNORMAL LOW (ref 4.22–5.81)
RDW: 13.1 % (ref 11.5–15.5)
WBC: 11.7 10*3/uL — ABNORMAL HIGH (ref 4.0–10.5)

## 2016-11-20 LAB — LIPASE, BLOOD: Lipase: 23 U/L (ref 11–51)

## 2016-11-20 MED ORDER — DEXTROSE 5 % IV SOLN
1.0000 g | Freq: Once | INTRAVENOUS | Status: AC
  Administered 2016-11-20: 1 g via INTRAVENOUS
  Filled 2016-11-20: qty 10

## 2016-11-20 MED ORDER — IOPAMIDOL (ISOVUE-300) INJECTION 61%
INTRAVENOUS | Status: AC
  Administered 2016-11-20: 100 mL
  Filled 2016-11-20: qty 100

## 2016-11-20 MED ORDER — NAPROXEN 375 MG PO TABS: 375.0000 mg | ORAL_TABLET | Freq: Two times a day (BID) | ORAL | 0 refills | Status: AC | PRN

## 2016-11-20 MED ORDER — AZITHROMYCIN 250 MG PO TABS
1000.0000 mg | ORAL_TABLET | Freq: Once | ORAL | Status: AC
  Administered 2016-11-20: 1000 mg via ORAL
  Filled 2016-11-20: qty 4

## 2016-11-20 MED ORDER — KETOROLAC TROMETHAMINE 15 MG/ML IJ SOLN
15.0000 mg | Freq: Once | INTRAMUSCULAR | Status: AC
  Administered 2016-11-20: 15 mg via INTRAVENOUS
  Filled 2016-11-20: qty 1

## 2016-11-20 MED ORDER — ONDANSETRON HCL 4 MG/2ML IJ SOLN
4.0000 mg | Freq: Once | INTRAMUSCULAR | Status: AC
  Administered 2016-11-20: 4 mg via INTRAVENOUS
  Filled 2016-11-20: qty 2

## 2016-11-20 MED ORDER — SODIUM CHLORIDE 0.9 % IV BOLUS (SEPSIS)
1000.0000 mL | Freq: Once | INTRAVENOUS | Status: AC
  Administered 2016-11-20: 1000 mL via INTRAVENOUS

## 2016-11-20 MED ORDER — DOXYCYCLINE HYCLATE 100 MG PO CAPS: 100.0000 mg | ORAL_CAPSULE | Freq: Two times a day (BID) | ORAL | 0 refills | Status: AC
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
## 2016-11-21 LAB — URINE CULTURE
CULTURE: NO GROWTH
Special Requests: NORMAL

## 2016-11-21 LAB — GC/CHLAMYDIA PROBE AMP (~~LOC~~) NOT AT ARMC
CHLAMYDIA, DNA PROBE: NEGATIVE
NEISSERIA GONORRHEA: NEGATIVE

## 2017-01-10 ENCOUNTER — Emergency Department (HOSPITAL_COMMUNITY)
Admission: EM | Admit: 2017-01-10 | Discharge: 2017-01-11 | Disposition: A | Discharge status: Home / Self Care | Payer: Medicaid Other | Attending: Emergency Medicine | Admitting: Emergency Medicine

## 2017-01-10 ENCOUNTER — Encounter (HOSPITAL_COMMUNITY): Payer: Self-pay | Admitting: Emergency Medicine

## 2017-01-10 DIAGNOSIS — Z79899 Other long term (current) drug therapy: Secondary | ICD-10-CM | POA: Diagnosis not present

## 2017-01-10 DIAGNOSIS — F4324 Adjustment disorder with disturbance of conduct: Secondary | ICD-10-CM | POA: Diagnosis present

## 2017-01-10 DIAGNOSIS — R462 Strange and inexplicable behavior: Secondary | ICD-10-CM | POA: Diagnosis present

## 2017-01-10 DIAGNOSIS — R443 Hallucinations, unspecified: Secondary | ICD-10-CM | POA: Insufficient documentation

## 2017-01-10 DIAGNOSIS — F1220 Cannabis dependence, uncomplicated: Secondary | ICD-10-CM | POA: Diagnosis present

## 2017-01-10 DIAGNOSIS — F17210 Nicotine dependence, cigarettes, uncomplicated: Secondary | ICD-10-CM | POA: Insufficient documentation

## 2017-01-10 LAB — CBC WITH DIFFERENTIAL/PLATELET
BASOS ABS: 0 10*3/uL (ref 0.0–0.1)
Basophils Relative: 0 %
Eosinophils Absolute: 0.1 10*3/uL (ref 0.0–0.7)
Eosinophils Relative: 1 %
HCT: 38 % — ABNORMAL LOW (ref 39.0–52.0)
HEMOGLOBIN: 13.3 g/dL (ref 13.0–17.0)
LYMPHS ABS: 2.1 10*3/uL (ref 0.7–4.0)
LYMPHS PCT: 38 %
MCH: 34.1 pg — AB (ref 26.0–34.0)
MCHC: 35 g/dL (ref 30.0–36.0)
MCV: 97.4 fL (ref 78.0–100.0)
Monocytes Absolute: 0.5 10*3/uL (ref 0.1–1.0)
Monocytes Relative: 9 %
NEUTROS ABS: 2.9 10*3/uL (ref 1.7–7.7)
NEUTROS PCT: 52 %
Platelets: 204 10*3/uL (ref 150–400)
RBC: 3.9 MIL/uL — AB (ref 4.22–5.81)
RDW: 12.5 % (ref 11.5–15.5)
WBC: 5.6 10*3/uL (ref 4.0–10.5)

## 2017-01-10 LAB — COMPREHENSIVE METABOLIC PANEL
ALT: 15 U/L — AB (ref 17–63)
ANION GAP: 7 (ref 5–15)
AST: 18 U/L (ref 15–41)
Albumin: 4.2 g/dL (ref 3.5–5.0)
Alkaline Phosphatase: 66 U/L (ref 38–126)
BUN: 18 mg/dL (ref 6–20)
CHLORIDE: 102 mmol/L (ref 101–111)
CO2: 28 mmol/L (ref 22–32)
Calcium: 8.9 mg/dL (ref 8.9–10.3)
Creatinine, Ser: 0.98 mg/dL (ref 0.61–1.24)
GFR calc non Af Amer: >60 mL/min (ref >60)
Glucose, Bld: 95 mg/dL (ref 65–99)
POTASSIUM: 3.5 mmol/L (ref 3.5–5.1)
SODIUM: 137 mmol/L (ref 135–145)
Total Bilirubin: 0.4 mg/dL (ref 0.3–1.2)
Total Protein: 7.1 g/dL (ref 6.5–8.1)

## 2017-01-10 LAB — RAPID URINE DRUG SCREEN, HOSP PERFORMED
AMPHETAMINES: NOT DETECTED
BENZODIAZEPINES: NOT DETECTED
Barbiturates: NOT DETECTED
Cocaine: NOT DETECTED
Opiates: NOT DETECTED
Tetrahydrocannabinol: POSITIVE — AB

## 2017-01-10 LAB — ETHANOL

## 2017-01-10 LAB — VALPROIC ACID LEVEL: Valproic Acid Lvl: <10 ug/mL — ABNORMAL LOW (ref 50.0–100.0)

## 2017-01-10 MED ORDER — ONDANSETRON HCL 4 MG PO TABS: 4.0000 mg | ORAL_TABLET | Freq: Three times a day (TID) | ORAL | Status: DC | PRN

## 2017-01-10 MED ORDER — IBUPROFEN 200 MG PO TABS: 600.0000 mg | ORAL_TABLET | Freq: Three times a day (TID) | ORAL | Status: DC | PRN

## 2017-01-10 MED ORDER — ASENAPINE MALEATE 5 MG SL SUBL
5.0000 mg | SUBLINGUAL_TABLET | Freq: Two times a day (BID) | SUBLINGUAL | Status: DC
  Administered 2017-01-10: 5 mg via SUBLINGUAL
  Filled 2017-01-10: qty 1

## 2017-01-10 MED ORDER — ALUM & MAG HYDROXIDE-SIMETH 200-200-20 MG/5ML PO SUSP: 30.0000 mL | ORAL | Status: DC | PRN

## 2017-01-10 NOTE — ED Notes (Signed)
Family at bedside. 
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
## 2017-01-10 NOTE — ED Notes (Signed)
Bed: WA27 Expected date:  Expected time:  Means of arrival:  Comments: [facility]

## 2017-01-10 NOTE — ED Notes (Signed)
SBAR Report received from previous nurse. Pt received calm and visible on unit. Ptdenies current SI/ HI, A/V H, depression, anxiety, or pain at this time, and appears to be responding at times, otherwise stable and free of distress. Pt reminded of camera surveillance, q 15 min rounds, and rules of the milieu. Will continue to assess.
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
## 2017-01-11 DIAGNOSIS — Z79899 Other long term (current) drug therapy: Secondary | ICD-10-CM | POA: Diagnosis not present

## 2017-01-11 DIAGNOSIS — F17210 Nicotine dependence, cigarettes, uncomplicated: Secondary | ICD-10-CM | POA: Diagnosis not present

## 2017-01-11 DIAGNOSIS — F4324 Adjustment disorder with disturbance of conduct: Secondary | ICD-10-CM | POA: Diagnosis not present

## 2017-01-11 DIAGNOSIS — F1220 Cannabis dependence, uncomplicated: Secondary | ICD-10-CM | POA: Diagnosis not present

## 2017-01-11 NOTE — ED Notes (Signed)
Family was never at bedside this shift.

## 2017-01-11 NOTE — ED Notes (Signed)
Pt discharged ambulatory with [facility]  All belongings were returned to pt.

## 2017-01-11 NOTE — Discharge Instructions (Signed)
For your ongoing mental health needs, you are advised to follow up with [facility].  New and returning patients are seen at their walk-in clinic.  Walk-in hours are Monday - Friday from 8:00 am - 3:00 pm.  Walk-in patients are seen on a first come, first served basis.  Try to arrive as early as possible for he best chance of being seen the same day: ° °     [facility] °     [address] °     [place], [place] [address] °     [phone] °
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
## 2017-01-11 NOTE — BH Assessment (Signed)
[facility] Assessment Progress Note  Per Thedore MinsMojeed Akintayo, MD, this pt does not require psychiatric hospitalization at this time.  Pt presents under IVC initiated by his mother, which Dr Jannifer FranklinAkintayo has rescinded.  [facility].  This has been included in pt's discharge instructions.  Pt's nurse, Kendal Hymendie, has been notified.  Doylene Canninghomas Jaquavius Hudler, MA Triage Specialist [phone]

## 2017-03-10 ENCOUNTER — Emergency Department (HOSPITAL_COMMUNITY)
Admission: EM | Admit: 2017-03-10 | Discharge: 2017-03-10 | Discharge status: Against Medical Advice | Payer: Medicaid Other | Attending: Emergency Medicine | Admitting: Emergency Medicine

## 2017-03-10 DIAGNOSIS — G8929 Other chronic pain: Secondary | ICD-10-CM

## 2017-03-10 DIAGNOSIS — R52 Pain, unspecified: Secondary | ICD-10-CM | POA: Insufficient documentation

## 2017-03-10 MED ORDER — NAPROXEN 500 MG PO TABS: 500.0000 mg | ORAL_TABLET | Freq: Two times a day (BID) | ORAL | 0 refills | Status: DC
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
## 2017-03-10 NOTE — ED Notes (Signed)
Pt seen by nurse first walking out of dept.

## 2017-03-10 NOTE — ED Notes (Signed)
Pt not staying awake long enough to speak with this RN or EDP. Pt continues to fall asleep.

## 2017-03-31 ENCOUNTER — Encounter (HOSPITAL_COMMUNITY): Payer: Self-pay | Admitting: Emergency Medicine

## 2017-03-31 ENCOUNTER — Emergency Department (HOSPITAL_COMMUNITY)
Admission: EM | Admit: 2017-03-31 | Discharge: 2017-04-02 | Disposition: A | Discharge status: Home / Self Care | Payer: Medicaid Other | Attending: Emergency Medicine | Admitting: Emergency Medicine

## 2017-03-31 DIAGNOSIS — Z79899 Other long term (current) drug therapy: Secondary | ICD-10-CM | POA: Insufficient documentation

## 2017-03-31 DIAGNOSIS — F17210 Nicotine dependence, cigarettes, uncomplicated: Secondary | ICD-10-CM | POA: Insufficient documentation

## 2017-03-31 DIAGNOSIS — F250 Schizoaffective disorder, bipolar type: Secondary | ICD-10-CM | POA: Diagnosis present

## 2017-03-31 DIAGNOSIS — F1220 Cannabis dependence, uncomplicated: Secondary | ICD-10-CM | POA: Diagnosis present

## 2017-03-31 LAB — CBC
HCT: 37.1 % — ABNORMAL LOW (ref 39.0–52.0)
Hemoglobin: 12.8 g/dL — ABNORMAL LOW (ref 13.0–17.0)
MCH: 34.5 pg — AB (ref 26.0–34.0)
MCHC: 34.5 g/dL (ref 30.0–36.0)
MCV: 100 fL (ref 78.0–100.0)
PLATELETS: 205 10*3/uL (ref 150–400)
RBC: 3.71 MIL/uL — ABNORMAL LOW (ref 4.22–5.81)
RDW: 12.4 % (ref 11.5–15.5)
WBC: 7.3 10*3/uL (ref 4.0–10.5)

## 2017-03-31 LAB — RAPID URINE DRUG SCREEN, HOSP PERFORMED
Amphetamines: NOT DETECTED
BENZODIAZEPINES: NOT DETECTED
Barbiturates: NOT DETECTED
Cocaine: NOT DETECTED
OPIATES: NOT DETECTED
Tetrahydrocannabinol: POSITIVE — AB

## 2017-03-31 LAB — COMPREHENSIVE METABOLIC PANEL
ALBUMIN: 4 g/dL (ref 3.5–5.0)
ALT: 17 U/L (ref 17–63)
ANION GAP: 6 (ref 5–15)
AST: 26 U/L (ref 15–41)
Alkaline Phosphatase: 73 U/L (ref 38–126)
BUN: 12 mg/dL (ref 6–20)
CO2: 29 mmol/L (ref 22–32)
Calcium: 8.9 mg/dL (ref 8.9–10.3)
Chloride: 106 mmol/L (ref 101–111)
Creatinine, Ser: 1.01 mg/dL (ref 0.61–1.24)
GFR calc Af Amer: >60 mL/min (ref >60)
GFR calc non Af Amer: >60 mL/min (ref >60)
Glucose, Bld: 126 mg/dL — ABNORMAL HIGH (ref 65–99)
POTASSIUM: 3.3 mmol/L — AB (ref 3.5–5.1)
SODIUM: 141 mmol/L (ref 135–145)
TOTAL PROTEIN: 6.6 g/dL (ref 6.5–8.1)
Total Bilirubin: 0.6 mg/dL (ref 0.3–1.2)

## 2017-03-31 LAB — ACETAMINOPHEN LEVEL: Acetaminophen (Tylenol), Serum: <10 ug/mL — ABNORMAL LOW (ref 10–30)

## 2017-03-31 LAB — ETHANOL

## 2017-03-31 LAB — SALICYLATE LEVEL

## 2017-03-31 NOTE — ED Notes (Signed)
[facility] given to [facility], [facility].

## 2017-03-31 NOTE — ED Notes (Signed)
Pt admitted to room #30, Pt disorganized on approach, delusional, paranoid. "I have been having a lot of agony in my brain and temple." Pt tangential on approach. Denies SI/HI. Encouragement and support provided. Special checks q 15 mins in place for safety, Video monitoring in place. Will continue to monitor.
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
## 2017-03-31 NOTE — ED Notes (Signed)
Bed: WTR5 Expected date:  Expected time:  Means of arrival:  Comments: 

## 2017-03-31 NOTE — Progress Notes (Signed)
CSW faxed patient's referral to: [facility], [place], [facility], [facility], [facility].   Celso SickleKimberly Quadry Kampa, LCSWA [facility]  Clinical Social Worker [phone]
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
## 2017-03-31 NOTE — ED Notes (Signed)
SBAR Report received from previous nurse. Pt received calm and visible on unit. Pt ignored questions related to current SI/ HI, A/V H, depression, anxiety, or pain at this time, but appears otherwise stable and free of distress. Pt reminded of camera surveillance, q 15 min rounds, and rules of the milieu. Will continue to assess.

## 2017-03-31 NOTE — ED Notes (Signed)
1 patient belonging bag placed in locker 30
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
## 2017-03-31 NOTE — ED Notes (Signed)
TTS at bedside to evaluate pt ?

## 2017-03-31 NOTE — ED Notes (Signed)
While walking to TCU, Pt very pleasant and wished staff a Happy Mother's Day.

## 2017-03-31 NOTE — ED Notes (Signed)
Bed: [facility]40 Expected date:  Expected time:  Means of arrival:  Comments: Hold for 30 
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
## 2017-04-01 DIAGNOSIS — R45850 Homicidal ideations: Secondary | ICD-10-CM

## 2017-04-01 DIAGNOSIS — F1290 Cannabis use, unspecified, uncomplicated: Secondary | ICD-10-CM | POA: Diagnosis not present

## 2017-04-01 DIAGNOSIS — F250 Schizoaffective disorder, bipolar type: Secondary | ICD-10-CM

## 2017-04-01 DIAGNOSIS — F17210 Nicotine dependence, cigarettes, uncomplicated: Secondary | ICD-10-CM

## 2017-04-01 MED ORDER — LORAZEPAM 1 MG PO TABS
1.0000 mg | ORAL_TABLET | Freq: Three times a day (TID) | ORAL | Status: DC | PRN
  Administered 2017-04-01: 1 mg via ORAL
  Filled 2017-04-01: qty 1

## 2017-04-01 MED ORDER — BENZTROPINE MESYLATE 0.5 MG PO TABS
0.5000 mg | ORAL_TABLET | Freq: Every day | ORAL | Status: DC
  Filled 2017-04-01: qty 1

## 2017-04-01 MED ORDER — DIPHENHYDRAMINE HCL 50 MG/ML IJ SOLN: 25.0000 mg | Freq: Once | INTRAMUSCULAR | Status: DC

## 2017-04-01 MED ORDER — ACETAMINOPHEN 325 MG PO TABS: 650.0000 mg | ORAL_TABLET | ORAL | Status: DC | PRN

## 2017-04-01 MED ORDER — HYDROXYZINE HCL 25 MG PO TABS
50.0000 mg | ORAL_TABLET | Freq: Once | ORAL | Status: AC
  Administered 2017-04-01: 50 mg via ORAL
  Filled 2017-04-01: qty 2

## 2017-04-01 MED ORDER — ONDANSETRON HCL 4 MG PO TABS: 4.0000 mg | ORAL_TABLET | Freq: Three times a day (TID) | ORAL | Status: DC | PRN

## 2017-04-01 MED ORDER — ALUM & MAG HYDROXIDE-SIMETH 200-200-20 MG/5ML PO SUSP: 30.0000 mL | ORAL | Status: DC | PRN

## 2017-04-01 MED ORDER — DIVALPROEX SODIUM ER 500 MG PO TB24: 500.0000 mg | ORAL_TABLET | Freq: Every day | ORAL | Status: DC

## 2017-04-01 MED ORDER — ARIPIPRAZOLE ER 400 MG IM SUSR: 400.0000 mg | INTRAMUSCULAR | Status: DC

## 2017-04-01 MED ORDER — LORAZEPAM 2 MG/ML IJ SOLN: 2.0000 mg | Freq: Once | INTRAMUSCULAR | Status: DC

## 2017-04-01 MED ORDER — ARIPIPRAZOLE 5 MG PO TABS
7.5000 mg | ORAL_TABLET | Freq: Every day | ORAL | Status: DC
  Filled 2017-04-01: qty 2

## 2017-04-01 MED ORDER — NICOTINE 21 MG/24HR TD PT24
21.0000 mg | MEDICATED_PATCH | Freq: Every day | TRANSDERMAL | Status: DC
  Filled 2017-04-01: qty 1

## 2017-04-01 MED ORDER — IBUPROFEN 200 MG PO TABS
600.0000 mg | ORAL_TABLET | Freq: Three times a day (TID) | ORAL | Status: DC | PRN
  Administered 2017-04-01: 600 mg via ORAL
  Filled 2017-04-01: qty 3

## 2017-04-01 MED ORDER — ARIPIPRAZOLE ER 400 MG IM SRER
400.0000 mg | Freq: Once | INTRAMUSCULAR | Status: AC
  Administered 2017-04-01: 400 mg via INTRAMUSCULAR
  Filled 2017-04-01: qty 400

## 2017-04-01 MED ORDER — TRIHEXYPHENIDYL HCL 2 MG PO TABS: 2.0000 mg | ORAL_TABLET | Freq: Two times a day (BID) | ORAL | Status: DC
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
## 2017-04-01 NOTE — Progress Notes (Signed)
04/01/17 1342:  LRT introduced self to pt and offered activities.  Pt expressed he wanted a book to read.  LRT went to look for a book but there were none available.  LRT informed pt there were not books and asked if there was anything else he wanted.  Pt declined and laid back down.  Caroll RancherMarjette Shigeko Manard, LRT/CTRS

## 2017-04-01 NOTE — BH Assessment (Signed)
[facility] Assessment Progress Note Patient accepted to 501-1 after 1900. [facility].

## 2017-04-01 NOTE — ED Notes (Signed)
Pt states that he already feels better after the Abilify injection. He feels less, "agony".

## 2017-04-01 NOTE — ED Notes (Signed)
[facility], [facility], RN unavailable
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
## 2017-04-02 ENCOUNTER — Inpatient Hospital Stay (HOSPITAL_COMMUNITY)
Admission: AD | Admit: 2017-04-02 | Discharge: 2017-04-10 | DRG: 885 | Disposition: A | Discharge status: Home / Self Care | Payer: Medicaid Other | Attending: Psychiatry | Admitting: Psychiatry

## 2017-04-02 ENCOUNTER — Encounter (HOSPITAL_COMMUNITY): Payer: Self-pay

## 2017-04-02 DIAGNOSIS — Z888 Allergy status to other drugs, medicaments and biological substances status: Secondary | ICD-10-CM

## 2017-04-02 DIAGNOSIS — F17210 Nicotine dependence, cigarettes, uncomplicated: Secondary | ICD-10-CM | POA: Diagnosis not present

## 2017-04-02 DIAGNOSIS — F4324 Adjustment disorder with disturbance of conduct: Secondary | ICD-10-CM | POA: Diagnosis present

## 2017-04-02 DIAGNOSIS — R443 Hallucinations, unspecified: Secondary | ICD-10-CM | POA: Diagnosis not present

## 2017-04-02 DIAGNOSIS — F1010 Alcohol abuse, uncomplicated: Secondary | ICD-10-CM | POA: Diagnosis present

## 2017-04-02 DIAGNOSIS — Z716 Tobacco abuse counseling: Secondary | ICD-10-CM | POA: Diagnosis not present

## 2017-04-02 DIAGNOSIS — F17200 Nicotine dependence, unspecified, uncomplicated: Secondary | ICD-10-CM | POA: Diagnosis present

## 2017-04-02 DIAGNOSIS — Z79899 Other long term (current) drug therapy: Secondary | ICD-10-CM

## 2017-04-02 DIAGNOSIS — F1290 Cannabis use, unspecified, uncomplicated: Secondary | ICD-10-CM | POA: Diagnosis not present

## 2017-04-02 DIAGNOSIS — F1220 Cannabis dependence, uncomplicated: Secondary | ICD-10-CM | POA: Diagnosis present

## 2017-04-02 DIAGNOSIS — F250 Schizoaffective disorder, bipolar type: Secondary | ICD-10-CM | POA: Diagnosis present

## 2017-04-02 MED ORDER — MAGNESIUM HYDROXIDE 400 MG/5ML PO SUSP: 30.0000 mL | Freq: Every day | ORAL | Status: DC | PRN

## 2017-04-02 MED ORDER — IBUPROFEN 600 MG PO TABS
600.0000 mg | ORAL_TABLET | Freq: Three times a day (TID) | ORAL | Status: DC | PRN
  Administered 2017-04-02 – 2017-04-09 (×6): 600 mg via ORAL
  Filled 2017-04-02 (×6): qty 1

## 2017-04-02 MED ORDER — DIVALPROEX SODIUM ER 500 MG PO TB24
500.0000 mg | ORAL_TABLET | Freq: Every day | ORAL | Status: DC
Start: 2017-04-02 — End: 2017-04-08
  Administered 2017-04-02 – 2017-04-07 (×4): 500 mg via ORAL
  Filled 2017-04-02 (×9): qty 1

## 2017-04-02 MED ORDER — ADULT MULTIVITAMIN W/MINERALS CH
1.0000 | ORAL_TABLET | Freq: Every day | ORAL | Status: DC
  Administered 2017-04-02 – 2017-04-10 (×9): 1 via ORAL
  Filled 2017-04-02 (×11): qty 1

## 2017-04-02 MED ORDER — HYDROXYZINE HCL 25 MG PO TABS
25.0000 mg | ORAL_TABLET | Freq: Four times a day (QID) | ORAL | Status: DC | PRN
  Administered 2017-04-05: 25 mg via ORAL
  Filled 2017-04-02 (×2): qty 1

## 2017-04-02 MED ORDER — ASENAPINE MALEATE 5 MG SL SUBL
5.0000 mg | SUBLINGUAL_TABLET | Freq: Two times a day (BID) | SUBLINGUAL | Status: DC | PRN
  Administered 2017-04-07 – 2017-04-08 (×2): 5 mg via SUBLINGUAL
  Filled 2017-04-02 (×3): qty 1

## 2017-04-02 MED ORDER — ARIPIPRAZOLE 5 MG PO TABS
5.0000 mg | ORAL_TABLET | ORAL | Status: DC
  Administered 2017-04-02 – 2017-04-09 (×13): 5 mg via ORAL
  Filled 2017-04-02 (×18): qty 1

## 2017-04-02 MED ORDER — AMANTADINE HCL 100 MG PO CAPS
100.0000 mg | ORAL_CAPSULE | Freq: Two times a day (BID) | ORAL | Status: DC
  Administered 2017-04-02 – 2017-04-10 (×11): 100 mg via ORAL
  Filled 2017-04-02 (×18): qty 1

## 2017-04-02 MED ORDER — ARIPIPRAZOLE ER 400 MG IM SRER: 400.0000 mg | INTRAMUSCULAR | Status: DC

## 2017-04-02 MED ORDER — ALUM & MAG HYDROXIDE-SIMETH 200-200-20 MG/5ML PO SUSP: 30.0000 mL | ORAL | Status: DC | PRN

## 2017-04-02 MED ORDER — TRAZODONE HCL 50 MG PO TABS
50.0000 mg | ORAL_TABLET | Freq: Every evening | ORAL | Status: DC | PRN
  Administered 2017-04-02 – 2017-04-09 (×13): 50 mg via ORAL
  Filled 2017-04-02 (×21): qty 1

## 2017-04-02 MED ORDER — ENSURE ENLIVE PO LIQD
237.0000 mL | Freq: Two times a day (BID) | ORAL | Status: DC
  Administered 2017-04-02 – 2017-04-10 (×9): 237 mL via ORAL

## 2017-04-02 MED ORDER — ACETAMINOPHEN 325 MG PO TABS
650.0000 mg | ORAL_TABLET | Freq: Four times a day (QID) | ORAL | Status: DC | PRN
  Administered 2017-04-02 – 2017-04-08 (×7): 650 mg via ORAL
  Filled 2017-04-02 (×11): qty 2

## 2017-04-02 NOTE — Social Work (Signed)
Referred to [facility] Team, is [place] resident.  Anne Cunningham, LCSW Lead Clinical Social Worker Phone:  [phone]  

## 2017-04-02 NOTE — Progress Notes (Signed)
D: Pt has been quiet and calm today. He has complained of a headache. Pt denied SI, HI, and AVH but his answers to questions suggested he might be RIS. No behavioral issues noted on the unit.  A: Meds given as ordered, including Tylenol and ibuprofen PRN. EKG done. Q15 safety checks maintained. Support/encouragement offered.  R: Pt remains free from harm and continues with treatment. Will continue to monitor for needs/safety.
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
## 2017-04-02 NOTE — Tx Team (Addendum)
Initial Treatment Plan 04/02/2017 1:48 AM Jerry [name] ZOX:[id number]    PATIENT STRESSORS: Health problems Marital or family conflict Medication change or noncompliance   PATIENT STRENGTHS: Ability for insight Active sense of humor Communication skills Physical Health   PATIENT IDENTIFIED PROBLEMS: Schizoaffective, Bipolar Type  Medication non compliance  "take some motrin for this agony"  Depression, Low Self esteem               DISCHARGE CRITERIA:  Ability to meet basic life and health needs Improved stabilization in mood, thinking, and/or behavior Safe-care adequate arrangements made Verbal commitment to aftercare and medication compliance  PRELIMINARY DISCHARGE PLAN: Attend aftercare/continuing care group Placement in alternative living arrangements  PATIENT/FAMILY INVOLVEMENT: This treatment plan has been presented to and reviewed with the patient, Jerry [name]. The patient and family have been given the opportunity to ask questions and make suggestions.  Aurora Maskwyman, Shawntee Mainwaring E, RN 04/02/2017, 1:48 AM

## 2017-04-02 NOTE — Progress Notes (Signed)
Recreation Therapy Notes  INPATIENT RECREATION THERAPY ASSESSMENT  Patient Details Name: Adline MangoMarkeyes L Rickels MRN: [id number] DOB: 05/20/1986 Today's Date: 04/02/2017  Patient Stressors: Other (Comment) ("People that misunderstand or miscomprehend me")  Pt stated he was here to catch up with Abilify and get back on sleep schedule.  Coping Skills:   Isolate, Arguments, Avoidance, Exercise, Art/Dance, Talking, Music, Sports  Personal Challenges: Anger, Decision-Making, Self-Esteem/Confidence, Stress Management, Time Management  Leisure Interests (2+):  Individual - Reading, Individual - Writing, Individual - Other (Comment) (Learn)  Awareness of Community Resources:  Yes  Community Resources:  Library, Other (Comment) Naval architect(Museum)  Current Use: Yes  Patient Strengths:  "dealing with the structure of things"  Patient Identified Areas of Improvement:  "environmental resources"  Current Recreation Participation:  Daily  Patient Goal for Hospitalization:  "Restructure my evaluation and my own output"  [place]  [place]idence[place]  Current [place] (including self-harm):  No  Current HI:  No  Consent to Intern Participation: N/A   Caroll RancherMarjette Nara Paternoster, LRT/CTRS  Caroll RancherLindsay, Skyleen Bentley A 04/02/2017, 12:47 PM

## 2017-04-02 NOTE — [facility] Group Notes (Signed)
[facility] LCSW Group Therapy  04/02/2017 , 1:35 PM   Type of Therapy:  Group Therapy  Participation Level:  Active  Participation Quality:  Attentive  Affect:  Appropriate  Cognitive:  Alert  Insight:  Improving  Engagement in Therapy:  Engaged  Modes of Intervention:  Discussion, Exploration and Socialization  Summary of Progress/Problems: Today's group focused on the term Diagnosis.  Participants were asked to define the term, and then pronounce whether it is a negative, positive or neutral term. Invited.  Chose to not attend. [name], Kirsten Spearing B 04/02/2017 , 1:35 PM
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
## 2017-04-02 NOTE — Progress Notes (Signed)
Recreation Therapy Notes  Date: 04/02/17 Time: 1000 Location: 500 Hall Dayroom  Group Topic: Goal Setting  Goal Area(s) Addresses:  Patient will be able to identify at least 3 life goals.  Patient will be able to identify benefit of investing in life goals.  Patient will be able to identify benefit of setting life goals.   Behavioral Response:  Engaged  Intervention: Goals worksheet  Activity: Life Goals.  Patients were given a worksheet that was broken down into 6 categories (family, friends, work/school, spirituality, body, mental health).  For each category, patients were to identify what they were doing well, what they needed to improve and create a goal to make the improvement.  Education:  Discharge Planning, Coping Skills, Goal Setting   Education Outcome: Acknowledges Education/In Group Clarification Provided/Needs Additional Education  Clinical Observations:  Pt identified family, friends and work/school as his top three categories.  Pt stated with family he does well at "focusing with the future leaders", wants to improve at compromise and has a goal of being more progressive and creative.  Pt expressed he does well at "building with endurance" with friends, wants to improve at using himself correctly and has no goal at this time.  For work/school, pt stated he does well with learning, wants to improve study/work flow habits and has a goal to "continue learning until I die".   Caroll RancherMarjette Jewel Venditto, LRT/CTRS         Lillia AbedLindsay, Semaj Coburn A 04/02/2017 11:50 AM

## 2017-04-02 NOTE — ED Notes (Signed)
Orders received to transfer patient. Pt aware and discharge summery reviewed with patient as well as review of medications. All personal items given to [employer] and pt ambulated without difficulty with Clinical research associatewriter.

## 2017-04-02 NOTE — Social Work (Deleted)
Feelings around Relapse. Group members discussed the meaning of relapse and shared personal stories of relapse, how it affected them and others, and how they perceived themselves during this time. Group members were encouraged to identify triggers, warning signs and coping skills used when facing the possibility of relapse. Social supports were discussed and explored in detail. Post Acute Withdrawal Syndrome (handout provided) was introduced and examined. Pt's were encouraged to ask questions, talk about key points associated with PAWS, and process this information in terms of relapse prevention.
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
## 2017-04-02 NOTE — Progress Notes (Signed)
Patient ID: Jerry [name], male   DOB: 02/07/1986, 31 y.o.   MRN: [id number]  Pt currently presents with a flat affect and labile behavior. Pt remains guarded around staff and patients. Pt has a difficult time staying asleep tonight. Pt awakes and is in an agitated state. Takes medication and returns to bed.  Pt provided with medications per providers orders. Pt's labs and vitals were monitored throughout the night. Pt given a 1:1 about emotional and mental status. Pt supported and encouraged to express concerns and questions. Pt educated on medications.  Pt's safety ensured with 15 minute and environmental checks. Pt currently denies SI/HI and A/V hallucinations. Pt verbally agrees to seek staff if SI/HI or A/VH occurs and to consult with staff before acting on any harmful thoughts. Will continue POC.

## 2017-04-02 NOTE — Progress Notes (Signed)
Pt did not attend wrap up group this evening.  

## 2017-04-02 NOTE — Progress Notes (Addendum)
NUTRITION ASSESSMENT  Pt identified as at risk on the Malnutrition Screen Tool  INTERVENTION: 1. Educated patient on the importance of nutrition and encouraged intake of food and beverages. 2. Discussed weight goals. 3. Supplements:  - will order Ensure Enlive BID, each supplement provides 350 kcal and 20 grams of protein - will order daily multivitamin with minerals.   NUTRITION DIAGNOSIS: Inadequate oral intake related to difficulty accessing food PTA as evidenced by pt report.   Goal: Pt to meet >/= 90% of their estimated nutrition needs.  Monitor:  PO intake  Assessment:  Pt admitted by IVC for aggressive and bizarre behavior. Mom reports pt has not been taking his medications. Per report, pt has difficulty accessing food. Will order Ensure supplements while pt is admitted to ensure adequate protein intake. Per review, pt gained 24 lbs from 07/25/16 to 03/10/17 and then subsequently lost 26 lbs from 03/10/17 through today; unsure if this is accurate.   31 y.o. male  Height: Ht Readings from Last 1 Encounters:  04/02/17 5\' 8"  (1.727 m)    Weight: Wt Readings from Last 1 Encounters:  04/02/17 164 lb (74.4 kg)    Weight Hx: Wt Readings from Last 10 Encounters:  04/02/17 164 lb (74.4 kg)  03/31/17 190 lb (86.2 kg)  03/10/17 190 lb (86.2 kg)  07/25/16 166 lb (75.3 kg)  03/10/16 190 lb (86.2 kg)  03/04/16 199 lb (90.3 kg)  10/03/15 187 lb 9.6 oz (85.1 kg)  03/10/15 184 lb (83.5 kg)  03/08/14 189 lb (85.7 kg)  04/15/09 175 lb 12.8 oz (79.7 kg)    BMI:  Body mass index is 24.94 kg/m. Pt meets criteria for normal weight/borderline overweight based on current BMI.  Estimated Nutritional Needs: Kcal: 25-30 kcal/kg Protein: > 1 gram protein/kg Fluid: 1 ml/kcal  Diet Order: Diet regular Room service appropriate? Yes; Fluid consistency: Thin Pt is also offered choice of unit snacks mid-morning and mid-afternoon.  Pt is eating as desired.   Lab results and medications  reviewed.     Trenton GammonJessica Vignesh Willert, MS, RD, LDN, [facility] Inpatient Clinical Dietitian Pager # [phone] After hours/weekend pager # [phone]

## 2017-04-02 NOTE — Discharge Instructions (Signed)
Transfer to [facility] 
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
## 2017-04-03 LAB — LIPID PANEL
CHOLESTEROL: 121 mg/dL (ref 0–200)
HDL: 48 mg/dL (ref >40)
LDL Cholesterol: 61 mg/dL (ref 0–99)
Total CHOL/HDL Ratio: 2.5 RATIO
Triglycerides: 62 mg/dL (ref <150)
VLDL: 12 mg/dL (ref 0–40)

## 2017-04-03 LAB — TSH: TSH: 0.937 u[IU]/mL (ref 0.350–4.500)

## 2017-04-03 NOTE — Progress Notes (Signed)
Patient ID: Jerry [name], male   DOB: 06/18/1986, 31 y.o.   MRN: [id number]  Pt currently presents with a flat affect and guarded behavior. Pt forwards little to Clinical research associatewriter. Refuses all medications expect Abilify. Pt states "Just to maintain it (long acting injection)." Increased paranoia surrounding treatment plan. Pt reports poor sleep with current medication regimen, refuses aide. Pt interacts minimally with peers, lies in his bedroom for most of the night.    Pt provided with medications per providers orders. Pt's labs and vitals were monitored throughout the night. Pt given a 1:1 about emotional and mental status. Pt supported and encouraged to express concerns and questions. Pt educated on medications.  Pt's safety ensured with 15 minute and environmental checks. Pt currently denies SI/HI and A/V hallucinations. Pt verbally agrees to seek staff if SI/HI or A/VH occurs and to consult with staff before acting on any harmful thoughts. Will continue POC.

## 2017-04-03 NOTE — Progress Notes (Signed)
[facility] Group Notes:  (Nursing/MHT/Case Management/Adjunct)  Date:  04/03/2017  Time:  8:52 PM  Type of Therapy:  Psychoeducational Skills  Participation Level:  Active  Participation Quality:  Attentive  Affect:  Blunted  Cognitive:  Lacking  Insight:  Limited  Engagement in Group:  Developing/Improving  Modes of Intervention:  Exploration  Summary of Progress/Problems: Patient described his day as having gone "pretty well". He states that he was grateful for taking a breath of air today and for going to the gym for recreation. As for the theme of the day, his personal development will include finding a place to live.   [name], Rayvin Abid S 04/03/2017, 8:52 PM

## 2017-04-03 NOTE — Plan of Care (Signed)
Problem: Coping: Goal: Ability to cope will improve Outcome: Progressing Nurse discussed depression/coping skills with patient.    

## 2017-04-03 NOTE — Progress Notes (Signed)
Patient has refused symmetrel which was scheduled twice today and ability this morning.  Medication information was given to patient per his request.  Patient stated he will review this information and talk to MD tomorrow morning. Several staff members have talked to patient today about his medications.

## 2017-04-03 NOTE — Tx Team (Signed)
Interdisciplinary Treatment and Diagnostic Plan Update  04/03/2017 Time of Session: 9:15 AM  Jerry [name] MRN: [id number]  Principal Diagnosis: Schizoaffective disorder, bipolar type ([place])  Secondary Diagnoses: Principal Problem:   Schizoaffective disorder, bipolar type ([place]) Active Problems:   Cannabis use disorder, moderate, dependence ([place])   Current Medications:  Current Facility-Administered Medications  Medication Dose Route Frequency Provider Last Rate Last Dose  . acetaminophen (TYLENOL) tablet 650 mg  650 mg Oral Q6H PRN Laverle Hobby, PA-C   650 mg at 04/02/17 1052  . alum & mag hydroxide-simeth (MAALOX/MYLANTA) 200-200-20 MG/5ML suspension 30 mL  30 mL Oral Q4H PRN Patriciaann Clan E, PA-C      . amantadine (SYMMETREL) capsule 100 mg  100 mg Oral BID Kerrie Buffalo, NP   100 mg at 04/02/17 1527  . ARIPiprazole (ABILIFY) tablet 5 mg  5 mg Oral Meade Maw, NP   5 mg at 04/02/17 2135  . [START ON 04/29/2017] ARIPiprazole ER SRER 400 mg  400 mg Intramuscular Q28 days Ursula Alert, MD      . asenapine (SAPHRIS) sublingual tablet 5 mg  5 mg Sublingual BID PRN Eappen, Ria Clock, MD      . divalproex (DEPAKOTE ER) 24 hr tablet 500 mg  500 mg Oral QHS Patriciaann Clan E, PA-C   500 mg at 04/02/17 2135  . feeding supplement (ENSURE ENLIVE) (ENSURE ENLIVE) liquid 237 mL  237 mL Oral BID BM Eappen, Saramma, MD   237 mL at 04/02/17 1525  . hydrOXYzine (ATARAX/VISTARIL) tablet 25 mg  25 mg Oral Q6H PRN Patriciaann Clan E, PA-C      . ibuprofen (ADVIL,MOTRIN) tablet 600 mg  600 mg Oral Q8H PRN Ursula Alert, MD   600 mg at 04/02/17 1525  . magnesium hydroxide (MILK OF MAGNESIA) suspension 30 mL  30 mL Oral Daily PRN Laverle Hobby, PA-C      . multivitamin with minerals tablet 1 tablet  1 tablet Oral Daily Ursula Alert, MD   1 tablet at 04/03/17 0806  . traZODone (DESYREL) tablet 50 mg  50 mg Oral QHS,MR X 1 Laverle Hobby, PA-C   50 mg at 04/03/17 4920    PTA  Medications: Prescriptions Prior to Admission  Medication Sig Dispense Refill Last Dose  . ARIPiprazole (ABILIFY) 15 MG tablet Take 0.5 tablets (7.5 mg total) by mouth daily. (Patient not taking: Reported on 11/20/2016) 30 tablet 0 Not Taking at Unknown time  . ARIPiprazole 400 MG SUSR Inject 400 mg into the muscle every 30 (thirty) days. First dose received on March 15, 2015, next dose due in thirty days on Apr 14, 2015 (Patient not taking: Reported on 11/20/2016) 1 each 0 Not Taking at Unknown time  . benztropine (COGENTIN) 0.5 MG tablet Take 1 tablet (0.5 mg total) by mouth daily. (Patient not taking: Reported on 11/20/2016) 30 tablet 0 Not Taking at Unknown time  . divalproex (DEPAKOTE ER) 500 MG 24 hr tablet Take 1 tablet (500 mg total) by mouth at bedtime. (Patient not taking: Reported on 11/20/2016) 30 tablet 0 Not Taking at Unknown time  . naproxen (NAPROSYN) 500 MG tablet Take 1 tablet (500 mg total) by mouth 2 (two) times daily. (Patient not taking: Reported on 03/31/2017) 30 tablet 0 Not Taking at Unknown time  . nicotine (NICODERM CQ - DOSED IN MG/24 HOURS) 21 mg/24hr patch Place 1 patch (21 mg total) onto the skin daily. (Patient not taking: Reported on 11/20/2016) 28 patch 0 Not Taking at  Unknown time    Treatment Modalities: Medication Management, Group therapy, Case management,  1 to 1 session with clinician, Psychoeducation, Recreational therapy.   Physician Treatment Plan for Primary Diagnosis: Schizoaffective disorder, bipolar type ([place]) Long Term Goal(s): Improvement in symptoms so as ready for discharge  Short Term Goals: Ability to identify changes in lifestyle to reduce recurrence of condition will improve Ability to verbalize feelings will improve Ability to disclose and discuss suicidal ideas Ability to demonstrate self-control will improve Ability to identify and develop effective coping behaviors will improve Ability to maintain clinical measurements within normal limits will  improve Compliance with prescribed medications will improve Ability to identify triggers associated with substance abuse/mental health issues will improve Ability to identify changes in lifestyle to reduce recurrence of condition will improve Ability to verbalize feelings will improve Ability to disclose and discuss suicidal ideas Ability to demonstrate self-control will improve Ability to identify and develop effective coping behaviors will improve Ability to maintain clinical measurements within normal limits will improve Compliance with prescribed medications will improve Ability to identify triggers associated with substance abuse/mental health issues will improve  Medication Management: Evaluate patient's response, side effects, and tolerance of medication regimen.  Therapeutic Interventions: 1 to 1 sessions, Unit Group sessions and Medication administration.  Evaluation of Outcomes: Progressing  Physician Treatment Plan for Secondary Diagnosis: Principal Problem:   Schizoaffective disorder, bipolar type ([place]) Active Problems:   Cannabis use disorder, moderate, dependence ([place])   Long Term Goal(s): Improvement in symptoms so as ready for discharge  Short Term Goals: Ability to identify changes in lifestyle to reduce recurrence of condition will improve Ability to verbalize feelings will improve Ability to disclose and discuss suicidal ideas Ability to demonstrate self-control will improve Ability to identify and develop effective coping behaviors will improve Ability to maintain clinical measurements within normal limits will improve Compliance with prescribed medications will improve Ability to identify triggers associated with substance abuse/mental health issues will improve Ability to identify changes in lifestyle to reduce recurrence of condition will improve Ability to verbalize feelings will improve Ability to disclose and discuss suicidal ideas Ability to demonstrate  self-control will improve Ability to identify and develop effective coping behaviors will improve Ability to maintain clinical measurements within normal limits will improve Compliance with prescribed medications will improve Ability to identify triggers associated with substance abuse/mental health issues will improve  Medication Management: Evaluate patient's response, side effects, and tolerance of medication regimen.  Therapeutic Interventions: 1 to 1 sessions, Unit Group sessions and Medication administration.  Evaluation of Outcomes: Progressing   RN Treatment Plan for Primary Diagnosis: Schizoaffective disorder, bipolar type ([place]) Long Term Goal(s): Knowledge of disease and therapeutic regimen to maintain health will improve  Short Term Goals: Ability to identify and develop effective coping behaviors will improve and Compliance with prescribed medications will improve  Medication Management: RN will administer medications as ordered by provider, will assess and evaluate patient's response and provide education to patient for prescribed medication. RN will report any adverse and/or side effects to prescribing provider.  Therapeutic Interventions: 1 on 1 counseling sessions, Psychoeducation, Medication administration, Evaluate responses to treatment, Monitor vital signs and CBGs as ordered, Perform/monitor CIWA, COWS, AIMS and Fall Risk screenings as ordered, Perform wound care treatments as ordered.  Evaluation of Outcomes: Progressing    Recreational Therapy Treatment Plan for Primary Diagnosis: Schizoaffective disorder, bipolar type ([place]) Long Term Goal(s): Patient will participate in recreation therapy treatment in at least 2 group sessions without prompting from LRT  Short Term Goals: Patient will improve self-esteem as demonstrated by ability to identify at least 5 positive qualities about him/herself by conclusion of recreation therapy treatment  Treatment Modalities: Group  and Pet Therapy  Therapeutic Interventions: Psychoeducation  Evaluation of Outcomes: Progressing   LCSW Treatment Plan for Primary Diagnosis: Schizoaffective disorder, bipolar type ([place]) Long Term Goal(s): Safe transition to appropriate next level of care at discharge, Engage patient in therapeutic group addressing interpersonal concerns.  Short Term Goals: Engage patient in aftercare planning with referrals and resources  Therapeutic Interventions: Assess for all discharge needs, 1 to 1 time with Social worker, Explore available resources and support systems, Assess for adequacy in community support network, Educate family and significant other(s) on suicide prevention, Complete Psychosocial Assessment, Interpersonal group therapy.  Evaluation of Outcomes: Met  Return home, follow up [facility]   Progress in Treatment: Attending groups: No Participating in groups: No Taking medication as prescribed: Yes Toleration medication: Yes, no side effects reported at this time Family/Significant other contact made: Yes Patient understands diagnosis: No  Limited insight Discussing patient identified problems/goals with staff: Yes Medical problems stabilized or resolved: Yes Denies suicidal/homicidal ideation: Yes Issues/concerns per patient self-inventory: None Other: N/A  New problem(s) identified: None identified at this time.   New Short Term/Long Term Goal(s): None identified at this time.   Discharge Plan or Barriers:   Reason for Continuation of Hospitalization: Paranoia Hallucinations  Medication stabilization   Estimated Length of Stay: 3-5 days  Attendees: Patient: 04/03/2017  9:15 AM  Physician: Ursula Alert, MD 04/03/2017  9:15 AM  Nursing: Elesa Massed RN 04/03/2017  9:15 AM  RN Care Manager: Lars Pinks, RN 04/03/2017  9:15 AM  Social Worker: Ripley Fraise 04/03/2017  9:15 AM  Recreational Therapist: Hyatt Capobianco  04/03/2017  9:15 AM  Other: Norberto Sorenson 04/03/2017  9:15 AM   Other:  04/03/2017  9:15 AM    Scribe for Treatment Team:  Roque Lias LCSW 04/03/2017 9:15 AM

## 2017-04-03 NOTE — [facility] Group Notes (Signed)
[facility] LCSW Group Therapy  04/03/2017 2:53 PM   Type of Therapy:  Group Therapy  Participation Level:  Active  Participation Quality:  Attentive  Affect:  Appropriate  Cognitive:  Appropriate  Insight:  Improving  Engagement in Therapy:  Engaged  Modes of Intervention:  Clarification, Education, Exploration and Socialization  Summary of Progress/Problems: Today's group focused on resilience.  Stayed the entire time, engaged for part of it.  Sleeping the other part. When questioned directly, was vague, evasive, disorganized.  "It's my humbleness that keeps me resilient. I'm on a new journey.  It's a war I have to wage. I did not choose it.  It chose me." [name], Ramiyah Mcclenahan B 04/03/2017 , 2:53 PM
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
## 2017-04-03 NOTE — Progress Notes (Signed)
D:  Patient denied SI and HI this morning while talking to nurse, contracts for safety.  Denied A/V hallucinations.  Denied pain.  Patient refused abilify this morning and also refused symmetrel this morning and at 1700.  MD informed in treatment team.   A:  Emotional support and encouragement given patient. R:  Safety maintained with 15 minute checks.

## 2017-04-03 NOTE — Progress Notes (Signed)
Recreation Therapy Notes  Date: 04/03/17 Time: 1000 Location: 500 Hall Dayroom  Group Topic: Self-Esteem  Goal Area(s) Addresses:  Patient will identify positive ways to increase self-esteem. Patient will verbalize benefit of increased self-esteem.  Behavioral Response: Engaged  Intervention: Blank masks, colored pencils  Activity: How I See Me.  Patients were given [name] blank mask.  On the front of the mask, patients were to drawn or write how they view themselves.  On the back of the mask, patients were to write how other people see them.  Education:  Self-Esteem, Building control surveyorDischarge Planning.   Education Outcome: Acknowledges education/In group clarification offered/Needs additional education  Clinical Observations/Feedback: Pt stated he sees himself as extraordinary because his "mental changes with thought and I'm like [name] cammillion, I adjust to the environment".  Pt expressed that others see him as [name] "unique question mark" because they never know what he is going to think.  Pt was bright and engaged throughout group.  Jerry RancherMarjette Neidra [name], Jerry [name]         Jerry [name], Jerry [name] 04/03/2017 1:19 PM

## 2017-04-04 LAB — HEMOGLOBIN A1C
Hgb A1c MFr Bld: 4.3 % — ABNORMAL LOW (ref 4.8–5.6)
MEAN PLASMA GLUCOSE: 77 mg/dL

## 2017-04-04 MED ORDER — NICOTINE 21 MG/24HR TD PT24
21.0000 mg | MEDICATED_PATCH | Freq: Every day | TRANSDERMAL | Status: DC
  Administered 2017-04-09 – 2017-04-10 (×2): 21 mg via TRANSDERMAL
  Filled 2017-04-04 (×7): qty 1
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
## 2017-04-04 NOTE — Progress Notes (Signed)
Patient ID: Jerry [name], male   DOB: 08/22/1986, 31 y.o.   MRN: [id number]   D  ---  Pt refused his 1700 hr dose of Amantadine 100 mg.   Pt was offered twice but refused each time

## 2017-04-04 NOTE — Progress Notes (Signed)
Patient ID: Jerry [name], male   DOB: 06/04/1986, 31 y.o.   MRN: [id number] D  ---  Pt agrees to contract for safety and denies pain.   He spends free time in his room but does interact well with peers.  He has shown no negative behaviors and is receptive to staff.  He has attended unit groups with good participation and appeaars vested in treatment.  Pt has taken meds as asked and shown no sign of adverse effects.  --- A ---  Provide support and safety  --- R ---  Pt remains safe on unit

## 2017-04-04 NOTE — Progress Notes (Signed)
Recreation Therapy Notes  Date: 04/04/17 Time: 1000 Location: 500 Hall Dayroom  Group Topic: Communication, Team Building, Problem Solving  Goal Area(s) Addresses:  Patient will effectively work with peer towards shared goal.  Patient will identify skill used to make activity successful.  Patient will identify how skills used during activity can be used to reach post d/c goals.   Intervention: STEM Activity   Activity: [place]. Patients were provided the following materials: 5 drinking straws, 5 rubber bands, 5 paper clips, 2 index cards, 2 drinking cups, and 2 toilet paper rolls. Using the provided materials patients were asked to build a launching mechanisms to launch a ping pong ball approximately 12 feet. Patients were divided into teams of 3-5.   Education: Pharmacist, communityocial Skills, Building control surveyorDischarge Planning.   Education Outcome: Acknowledges education/In group clarification offered/Needs additional education.   Clinical Observations/Feedback: Pt did not attend group.   Caroll RancherMarjette Lura Falor, LRT/CTRS         Caroll RancherLindsay, Marika Mahaffy A 04/04/2017 12:50 PM

## 2017-04-04 NOTE — Plan of Care (Signed)
Problem: Safety: Goal: Periods of time without injury will increase Outcome: Progressing Pt remains safe on the unit tonight. Pt redirectable when agitated.

## 2017-04-04 NOTE — [facility] Group Notes (Signed)
[facility]  04/04/2017 , 1:31 PM    Type of Therapy:  [facility] [employer] Presentation  Participation Level:  Active  Participation Quality:  Attentive  Affect:  Blunted  Cognitive:  Oriented  Insight:  Limited  Engagement in Therapy:  Engaged  Modes of Intervention:  Discussion, Education and Socialization  Summary of Progress/Problems:  [name] from [employer] came to present his recovery story and play the guitar.  Stayed the entire itme, appeared to be sleeping for much of the time.  [name], [name] 04/04/2017 , 1:31 PM

## 2017-04-05 DIAGNOSIS — R443 Hallucinations, unspecified: Secondary | ICD-10-CM

## 2017-04-05 DIAGNOSIS — F17210 Nicotine dependence, cigarettes, uncomplicated: Secondary | ICD-10-CM

## 2017-04-05 NOTE — [facility] Group Notes (Signed)
[facility] LCSW Group Therapy  04/05/2017  1:05 PM  Type of Therapy:  Group therapy  Participation Level:  Active  Participation Quality:  Attentive  Affect:  Flat  Cognitive:  Oriented  Insight:  Limited  Engagement in Therapy:  Limited  Modes of Intervention:  Discussion, Socialization  Summary of Progress/Problems:  Chaplain was here to lead a group on themes of hope and courage. "We are all the same, but we are all unique."  Didn't make much sense, but other patients took it as deep and gave him "amens."  Went on to say we are all connected, and that is the source of hope. Left early and did not return. [name], Jerry [name] 04/05/2017 12:59 PM

## 2017-04-05 NOTE — Progress Notes (Signed)
Recreation Therapy Notes  Date: 04/05/17 Time: 1000 Location: 500 Hall Dayroom  Group Topic: Anger Management  Goal Area(s) Addresses:  Patient will identify triggers for anger.  Patient will identify physical reaction to anger.   Patient will identify benefit of using coping skills when angry.  Behavioral Response: Engaged  Intervention: Thermometer worksheet, pencils  Activity:  Anger Thermometer.  Patients were given a sheet with a thermometer on it.  The thermometer was numbered from 1-10.  Patients were to rank the things that get them angry, 1- being the calmest to 10- being the angriest.  Patients were to also identify 5 coping skills to deal with what gets them angry.  Education: Anger Management, Discharge Planning   Education Outcome: Acknowledges education/In group clarification offered/Needs additional education.   Clinical Observations/Feedback: Pt stated gets angry with "hypocrites, being misunderstood, false proposals, false maneuvers, disturbances, war with no peace and people that start things and can't finish them".  Pt expressed his coping skills were "music, organizing emotions, being personified and being accelerated".  Pt stated using his coping skills help him to be positive and helps give him a better outlook on things.    Caroll RancherMarjette Maanvi Lecompte, LRT/CTRS          Caroll RancherLindsay, Shon Indelicato A 04/05/2017 11:36 AM

## 2017-04-05 NOTE — Progress Notes (Signed)
Nursing Progress Note: 7p-7a D: Pt currently presents with a flat/depressed affect and behavior. Pt states "I don't want to take depakote anymore. [facility] is [facility] Interacting minimally with milieu. Pt reports fair sleep with current medication regimen.   A: Pt provided with medications per providers orders. Pt's labs and vitals were monitored throughout the night. Pt supported emotionally and encouraged to express concerns and questions. Pt educated on medications.  R: Pt's safety ensured with 15 minute and environmental checks. Pt currently denies SI/HI/Self Harm and AVH. Pt verbally contracts to seek staff if SI/HI or A/VH occurs and to consult with staff before acting on any harmful thoughts. Will continue to monitor.

## 2017-04-05 NOTE — Progress Notes (Signed)
DAR NOTE: Patient presents with flat affect and depressed mood.  Denies auditory and visual hallucinations.  Described energy level as normal and concentration as good.  Rates depression at 3, hopelessness at 0, and anxiety at 5.  Maintained on routine safety checks.  Medications given as prescribed.  Support and encouragement offered as needed.  Attended group and participated.  States goal for today is "study habits."  Patient was visible in the dayroom.  No interaction with staff or peers.  Refused morning Symmetrel after several encouragements.  MD made aware.  Offered no complaint.

## 2017-04-05 NOTE — Progress Notes (Addendum)
Pt was appearing increasingly anxious, pacing the halls, and appearing to be responding to internal stimuli. This Clinical research associatewriter approached pt in room to speak with him and he was very guarded. In previous interactions, pt had been polite if somewhat aloof. Now he was uncooperative and defensive. He said he did not need and would not take any medication other than Abilify. (He had been offered PRN Saphris as well as scheduled Symmetrel.) He spoke of bringing charges against the hospital and made other delusional, grandiose statements. He said he was in the hospital to work on his self-esteem and denied hallucinating; thus, he said, he did not need such medications. Pt is now quiet and his room. Will continue to monitor for needs/safety.
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
## 2017-04-05 NOTE — Progress Notes (Signed)
Adult Psychoeducational Group Note  Date:  04/05/2017 Time:  8:52 PM  Group Topic/Focus:  Wrap-Up Group:   The focus of this group is to help patients review their daily goal of treatment and discuss progress on daily workbooks.  Participation Level:  Active  Participation Quality:  Appropriate  Affect:  Appropriate  Cognitive:  Appropriate  Insight: Appropriate  Engagement in Group:  Engaged  Modes of Intervention:  Discussion  Additional Comments:  The patient  Expressed that he attended groups  and had a good day.  Octavio Mannshigpen, Tajanae Guilbault Lee 04/05/2017, 8:52 PM

## 2017-04-06 NOTE — Progress Notes (Signed)
D: Pt A & O to self, place and situation. Presents guarded with flat affect but brightens up a little on approach. Minimal but appropriate interactions noted with peers and staff. Denies SI, HI, AVH and pain at time of assessment. Took his medications when offered. Voiced concern about getting d/c soon.  A: Support and availability provided to pt. Medications administered as prescribed and effects monitored. Encouraged participation in groups and unit activities. Safety checks maintained at Q 15 minutes intervals without outburst.  R: Pt appears to be in no physical distress. Tolerates all PO intake well. Remains safe on and off unit.

## 2017-04-06 NOTE — Progress Notes (Signed)
Pt polite but visably agitated.  Pt c/o head ache and is upset that staff will not knock on door upon entering pt's room. Pt given PRN pain med.  Pt advised of hospital policy to keep door open for visible 15 minuet checks.  Pt calms down and goes to bed and leaves door open. Pt remains safe on unit.
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
## 2017-04-06 NOTE — [facility] Group Notes (Signed)
[facility] Group Notes:  (Clinical Social Work)  04/06/2017  11:15-12:00PM  Summary of Progress/Problems:   Today's process group involved patients discussing their feelings related to being hospitalized, as well as benefits they see to being in the hospital.   The patient expressed a primary feeling about being hospitalized is that "it's okay but [facility]  His speech was disorganized and hard to understand every time he spoke.  Type of Therapy:  Group Therapy - Process  Participation Level:  Minimal  Participation Quality:  Sharing  Affect:  Blunted  Cognitive:  Disorganized  Insight:  Poor  Engagement in Therapy:  Poor  Modes of Intervention:  Exploration, Discussion  Ambrose MantleMareida Grossman-Orr, LCSW 04/06/2017, 1:27 PM

## 2017-04-06 NOTE — Progress Notes (Signed)
D: Pt at the time of assessment was flat, isolative to self even while in the dayroom. Pt however, denied depression, anxiety, pain, SI, HI or AVH; states, as of this moment I feel good-no voices nothing." Pt remained calm and cooperative. A: Medications offered as prescribed. All patient's questions and concerns addressed. Support, encouragement, and safe environment provided. Will continue to monitor for any changes. 15-minute safety checks continue. R: Pt was med compliant. Pt did attend wrap-up group. Safety checks continue

## 2017-04-07 DIAGNOSIS — F1220 Cannabis dependence, uncomplicated: Secondary | ICD-10-CM
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
## 2017-04-07 NOTE — [facility] Group Notes (Signed)
[facility] Group Notes: (Clinical Social Work)   04/07/2017      Type of Therapy:  Group Therapy   Participation Level:  Did Not Attend despite MHT prompting   Ambrose MantleMareida Grossman-Orr, LCSW 04/07/2017, 12:37 PM

## 2017-04-07 NOTE — Progress Notes (Signed)
Patient remains paranoid stating, "my jaw hurts. I think it's poisoned. It hurts on my L side." Tylenol provided. Will ask night RN to reassess pain level.

## 2017-04-07 NOTE — Progress Notes (Signed)
D: Spoke with patient 1:1 who is guarded, isolative to self. Patient interrupted this Clinical research associatewriter and stated, "no I'm not hearing or seeing anything. No I don't want to hurt anyone or myself. I'm fine." Patient's affect suspicious with congruent mood. Rating depression at a 4/10, hopelessness at a 3/10 and anxiety at a 2/10. Rates sleep as fair, appetite as fair, energy as high and concentration as good. Late morning patient was observed to be animated, talking to door frame to dayroom, gesturing at it. Then abruptly walked away. States goal for today is "how far I plan on furthering more of my short term learning ability, study organized strategies, have a great learning experience." Denies pain, physical problems.   A: Medicated per orders, prn saphris given for agitation, hallucinations. Emotional support offered and self inventory reviewed. Discussed POC with MD, NP.    R: Patient verbalizes understanding of POC. On reassess, patient calmer but remains guarded. Patient denies SI/HI and remains safe on level III obs. Will continue to monitor closely.

## 2017-04-07 NOTE — Plan of Care (Signed)
Problem: Education: Goal: Verbalization of understanding the information provided will improve Outcome: Progressing Patient verbalizes understanding of information, education provided.  Problem: Physical Regulation: Goal: Ability to maintain clinical measurements within normal limits will improve Outcome: Progressing VS remain stable.

## 2017-04-08 LAB — VALPROIC ACID LEVEL: VALPROIC ACID LVL: 38 ug/mL — AB (ref 50.0–100.0)

## 2017-04-08 MED ORDER — LORAZEPAM 1 MG PO TABS
ORAL_TABLET | ORAL | Status: AC
  Filled 2017-04-08: qty 1

## 2017-04-08 MED ORDER — BENZOCAINE 10 % MT GEL
Freq: Three times a day (TID) | OROMUCOSAL | Status: DC | PRN
  Administered 2017-04-08: 15:00:00 via OROMUCOSAL
  Filled 2017-04-08: qty 9.4

## 2017-04-08 MED ORDER — LORAZEPAM 1 MG PO TABS
1.0000 mg | ORAL_TABLET | Freq: Once | ORAL | Status: AC
  Administered 2017-04-08: 1 mg via ORAL

## 2017-04-08 MED ORDER — DIVALPROEX SODIUM ER 250 MG PO TB24
750.0000 mg | ORAL_TABLET | Freq: Every day | ORAL | Status: DC
  Administered 2017-04-08: 750 mg via ORAL
  Filled 2017-04-08 (×2): qty 3

## 2017-04-08 NOTE — [facility] Group Notes (Signed)
[facility] LCSW Group Therapy  04/08/2017 1:15 pm  Type of Therapy: Process Group Therapy  Participation Level:  Active  Participation Quality:  Appropriate  Affect:  Flat  Cognitive:  Oriented  Insight:  Improving  Engagement in Group:  Limited  Engagement in Therapy:  Limited  Modes of Intervention:  Activity, Clarification, Education, Problem-solving and Support  Summary of Progress/Problems: Today's group addressed the issue of overcoming obstacles.  Patients were asked to identify their biggest obstacle post d/c that stands in the way of their on-going success, and then problem solve as to how to manage this. Stayed the entire time, engaged throughout.  "You can remove obstacles by counting your blessings.  You have to bless the water, not just the food."  Speaking in abstractions, vague references.  He also apologized to each group member "in case I have offended you."  After group, he and I called his mother, who was happy to hear that he sounds better, but also stated she is living pillar to post and does not have a place for him to stay.  We called his father, who said he is waiting to hear back from someone who has a boarding house.  Vandell implored his father to call them back and not just wait for a call back.  We agreed to reach out to each other again tomorrow.  [name], Maribelle Hopple B 04/08/2017   3:25 PM

## 2017-04-08 NOTE — Plan of Care (Signed)
Problem: Health Behavior/Discharge Planning: Goal: Compliance with prescribed medication regimen will improve Outcome: Progressing Patient is compliant with medication regimen.  Patient took all prescribed medication.
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
## 2017-04-08 NOTE — Progress Notes (Signed)
D: Pt was in bed in his room upon initial approach.  Pt presents with depressed affect and mood.  Pt reports he had pain from a headache earlier but "I fixed it down here."  Pt denies pain now.  His goal is to "get some rest."  Pt denies SI/HI, denies hallucinations.  Pt has stayed in his room for the majority of the night.  He forwards little information to Clinical research associatewriter.     A: Introduced self to pt.  Actively listened to pt and offered support and encouragement. Medications administered per order.  Q15 minute safety checks maintained.  R: Pt is safe on the unit.  Pt is compliant with medications.  Pt verbally contracts for safety.  Will continue to monitor and assess.

## 2017-04-08 NOTE — Progress Notes (Signed)
  D: Pt appeared to be more anxious today than previous day that he was observed by the Clinical research associatewriter.  Pt didn't engage the writer in conversation, but did deny all SI, HI, and A/V. Pt also complained of pain on the left side of his jaw. Pt has no other questions or concerns.   A: Pt given prn pain med. Support and encouragement was offered. 15 min checks continued for safety.  R: Pt remains safe.

## 2017-04-08 NOTE — Tx Team (Signed)
Interdisciplinary Treatment and Diagnostic Plan Update  04/08/2017 Time of Session: 8:44 AM  Jerry [name] MRN: [id number]  Principal Diagnosis: Schizoaffective disorder, bipolar type (HCC)  Secondary Diagnoses: Principal Problem:   Schizoaffective disorder, bipolar type (HCC) Active Problems:   Cannabis use disorder, moderate, dependence (HCC)   Current Medications:  Current Facility-Administered Medications  Medication Dose Route Frequency Provider Last Rate Last Dose  . acetaminophen (TYLENOL) tablet 650 mg  650 mg Oral Q6H PRN Kerry HoughSimon, Spencer E, PA-C   650 mg at 04/08/17 [id number]  . alum & mag hydroxide-simeth (MAALOX/MYLANTA) 200-200-20 MG/5ML suspension 30 mL  30 mL Oral Q4H PRN Donell SievertSimon, Spencer E, PA-C      . amantadine (SYMMETREL) capsule 100 mg  100 mg Oral BID Adonis BrookAgustin, Sheila, NP   100 mg at 04/08/17 0752  . ARIPiprazole (ABILIFY) tablet 5 mg  5 mg Oral Unk LightningBH-qamhs Agustin, Sheila, NP   5 mg at 04/08/17 [id number]  . [START ON 04/29/2017] ARIPiprazole ER SRER 400 mg  400 mg Intramuscular Q28 days Jomarie LongsEappen, Saramma, MD      . asenapine (SAPHRIS) sublingual tablet 5 mg  5 mg Sublingual BID PRN Jomarie LongsEappen, Saramma, MD   5 mg at 04/07/17 1109  . divalproex (DEPAKOTE ER) 24 hr tablet 500 mg  500 mg Oral QHS Donell SievertSimon, Spencer E, PA-C   500 mg at 04/07/17 2044  . feeding supplement (ENSURE ENLIVE) (ENSURE ENLIVE) liquid 237 mL  237 mL Oral BID BM Eappen, Saramma, MD   Stopped at 04/05/17 1517  . hydrOXYzine (ATARAX/VISTARIL) tablet 25 mg  25 mg Oral Q6H PRN Donell SievertSimon, Spencer E, PA-C   25 mg at 04/05/17 1239  . ibuprofen (ADVIL,MOTRIN) tablet 600 mg  600 mg Oral Q8H PRN Jomarie LongsEappen, Saramma, MD   600 mg at 04/08/17 0020  . magnesium hydroxide (MILK OF MAGNESIA) suspension 30 mL  30 mL Oral Daily PRN Kerry HoughSimon, Spencer E, PA-C      . multivitamin with minerals tablet 1 tablet  1 tablet Oral Daily Jomarie LongsEappen, Saramma, MD   1 tablet at 04/08/17 0752  . nicotine (NICODERM CQ - dosed in mg/24 hours) patch 21 mg  21 mg  Transdermal Daily Simon, Spencer E, PA-C      . traZODone (DESYREL) tablet 50 mg  50 mg Oral QHS,MR X 1 Kerry HoughSimon, Spencer E, PA-C   50 mg at 04/08/17 0020    PTA Medications: Prescriptions Prior to Admission  Medication Sig Dispense Refill Last Dose  . ARIPiprazole (ABILIFY) 15 MG tablet Take 0.5 tablets (7.5 mg total) by mouth daily. (Patient not taking: Reported on 11/20/2016) 30 tablet 0 Not Taking at Unknown time  . ARIPiprazole 400 MG SUSR Inject 400 mg into the muscle every 30 (thirty) days. First dose received on March 15, 2015, next dose due in thirty days on Apr 14, 2015 (Patient not taking: Reported on 11/20/2016) 1 each 0 Not Taking at Unknown time  . benztropine (COGENTIN) 0.5 MG tablet Take 1 tablet (0.5 mg total) by mouth daily. (Patient not taking: Reported on 11/20/2016) 30 tablet 0 Not Taking at Unknown time  . divalproex (DEPAKOTE ER) 500 MG 24 hr tablet Take 1 tablet (500 mg total) by mouth at bedtime. (Patient not taking: Reported on 11/20/2016) 30 tablet 0 Not Taking at Unknown time  . naproxen (NAPROSYN) 500 MG tablet Take 1 tablet (500 mg total) by mouth 2 (two) times daily. (Patient not taking: Reported on 03/31/2017) 30 tablet 0 Not Taking at Unknown time  . nicotine (NICODERM  CQ - DOSED IN MG/24 HOURS) 21 mg/24hr patch Place 1 patch (21 mg total) onto the skin daily. (Patient not taking: Reported on 11/20/2016) 28 patch 0 Not Taking at Unknown time    Treatment Modalities: Medication Management, Group therapy, Case management,  1 to 1 session with clinician, Psychoeducation, Recreational therapy.   Physician Treatment Plan for Primary Diagnosis: Schizoaffective disorder, bipolar type (HCC) Long Term Goal(s): Improvement in symptoms so as ready for discharge  Short Term Goals: Ability to identify changes in lifestyle to reduce recurrence of condition will improve Ability to verbalize feelings will improve Ability to disclose and discuss suicidal ideas Ability to demonstrate  self-control will improve Ability to identify and develop effective coping behaviors will improve Ability to maintain clinical measurements within normal limits will improve Compliance with prescribed medications will improve Ability to identify triggers associated with substance abuse/mental health issues will improve Ability to identify changes in lifestyle to reduce recurrence of condition will improve Ability to verbalize feelings will improve Ability to disclose and discuss suicidal ideas Ability to demonstrate self-control will improve Ability to identify and develop effective coping behaviors will improve Ability to maintain clinical measurements within normal limits will improve Compliance with prescribed medications will improve Ability to identify triggers associated with substance abuse/mental health issues will improve  Medication Management: Evaluate patient's response, side effects, and tolerance of medication regimen.  Therapeutic Interventions: 1 to 1 sessions, Unit Group sessions and Medication administration.  Evaluation of Outcomes: Adequate for Discharge  Physician Treatment Plan for Secondary Diagnosis: Principal Problem:   Schizoaffective disorder, bipolar type (HCC) Active Problems:   Cannabis use disorder, moderate, dependence (HCC)   Long Term Goal(s): Improvement in symptoms so as ready for discharge  Short Term Goals: Ability to identify changes in lifestyle to reduce recurrence of condition will improve Ability to verbalize feelings will improve Ability to disclose and discuss suicidal ideas Ability to demonstrate self-control will improve Ability to identify and develop effective coping behaviors will improve Ability to maintain clinical measurements within normal limits will improve Compliance with prescribed medications will improve Ability to identify triggers associated with substance abuse/mental health issues will improve Ability to identify  changes in lifestyle to reduce recurrence of condition will improve Ability to verbalize feelings will improve Ability to disclose and discuss suicidal ideas Ability to demonstrate self-control will improve Ability to identify and develop effective coping behaviors will improve Ability to maintain clinical measurements within normal limits will improve Compliance with prescribed medications will improve Ability to identify triggers associated with substance abuse/mental health issues will improve  Medication Management: Evaluate patient's response, side effects, and tolerance of medication regimen.  Therapeutic Interventions: 1 to 1 sessions, Unit Group sessions and Medication administration.  Evaluation of Outcomes: Adequate for Discharge   RN Treatment Plan for Primary Diagnosis: Schizoaffective disorder, bipolar type (HCC) Long Term Goal(s): Knowledge of disease and therapeutic regimen to maintain health will improve  Short Term Goals: Ability to identify and develop effective coping behaviors will improve and Compliance with prescribed medications will improve  Medication Management: RN will administer medications as ordered by provider, will assess and evaluate patient's response and provide education to patient for prescribed medication. RN will report any adverse and/or side effects to prescribing provider.  Therapeutic Interventions: 1 on 1 counseling sessions, Psychoeducation, Medication administration, Evaluate responses to treatment, Monitor vital signs and CBGs as ordered, Perform/monitor CIWA, COWS, AIMS and Fall Risk screenings as ordered, Perform wound care treatments as ordered.  Evaluation of Outcomes: Adequate  for Discharge    Recreational Therapy Treatment Plan for Primary Diagnosis: Schizoaffective disorder, bipolar type (HCC) Long Term Goal(s): Patient will participate in recreation therapy treatment in at least 2 group sessions without prompting from LRT  Short  Term Goals: Patient will improve self-esteem as demonstrated by ability to identify at least 5 positive qualities about him/herself by conclusion of recreation therapy treatment  Treatment Modalities: Group and Pet Therapy  Therapeutic Interventions: Psychoeducation  Evaluation of Outcomes: Adequate for Discharge   LCSW Treatment Plan for Primary Diagnosis: Schizoaffective disorder, bipolar type (HCC) Long Term Goal(s): Safe transition to appropriate next level of care at discharge, Engage patient in therapeutic group addressing interpersonal concerns.  Short Term Goals: Engage patient in aftercare planning with referrals and resources  Therapeutic Interventions: Assess for all discharge needs, 1 to 1 time with Social worker, Explore available resources and support systems, Assess for adequacy in community support network, Educate family and significant other(s) on suicide prevention, Complete Psychosocial Assessment, Interpersonal group therapy.  Evaluation of Outcomes: Progressing  Will contact mother today to find out if she will allow pt to return home, follow up [facility]   Progress in Treatment: Attending groups: No Participating in groups: No Taking medication as prescribed: Yes Toleration medication: Yes, no side effects reported at this time Family/Significant other contact made: Yes Patient understands diagnosis: No  Limited insight Discussing patient identified problems/goals with staff: Yes Medical problems stabilized or resolved: Yes Denies suicidal/homicidal ideation: Yes Issues/concerns per patient self-inventory: None Other: N/A  New problem(s) identified: 5/21: Depakote level is low-depakote to be increased today  New Short Term/Long Term Goal(s): None identified at this time.   Discharge Plan or Barriers:   Reason for Continuation of Hospitalization:  Medication stabilization   Estimated Length of Stay: Likely D/C tomorrow, Wed at the  latest  Attendees: Patient: 04/08/2017  8:44 AM  Physician: Jomarie Longs, MD 04/08/2017  8:44 AM  Nursing: Liborio Nixon RN 04/08/2017  8:44 AM  RN Care Manager: Onnie Boer, RN 04/08/2017  8:44 AM  Social Worker: Richelle Ito 04/08/2017  8:44 AM  Recreational Therapist: Royston Cowper  04/08/2017  8:44 AM  Other: Tomasita Morrow 04/08/2017  8:44 AM  Other:  04/08/2017  8:44 AM    Scribe for Treatment Team:  Daryel Gerald LCSW 04/08/2017 8:44 AM

## 2017-04-08 NOTE — Progress Notes (Signed)
Psychoeducational Group Note  Date:  04/08/2017 Time:  2054  Group Topic/Focus:  Wrap-Up Group:   The focus of this group is to help patients review their daily goal of treatment and discuss progress on daily workbooks.  Participation Level: Did Not Attend  Participation Quality:  Not Applicable  Affect:  Not Applicable  Cognitive:  Not Applicable  Insight:  Not Applicable  Engagement in Group: Not Applicable  Additional Comments:  The patient did not attend group this evening.   [name], Xanthe Couillard S 04/08/2017, 8:54 PM

## 2017-04-08 NOTE — Progress Notes (Signed)
  D: Pt was in the hall and appeared to be very paranoid. Pt was suspicious of another nurse who was attempting to help him. When the writer attempted to speak to the pt he informed that he was "going to leave tonight".  Writer noticed the pt's increased agitation and offered prn medications. Pt refused.  A:  Support and encouragement was offered. 15 min checks continued for safety.  R: Pt remains safe.

## 2017-04-08 NOTE — Progress Notes (Signed)
Recreation Therapy Notes  Date: 04/08/17 Time: 1000 Location: 500 Hall Dayroom  Group Topic: Coping Skills  Goal Area(s) Addresses:  Patients will be able to identify coping skills. Patients will be able to identify the importance of positive coping skills. Patients will be able to identify importance of using coping skills post d/c.  Behavioral Response: Minimal  Intervention: Mind map, pencils  Activity: Mind map.  Patients were given a blank mind map.  LRT and patients filled in the first eight boxes with potential triggers together.  Patients were to then come up with three coping skills for each of the eight triggers identified by patients and LRT.  Education: PharmacologistCoping Skills, Building control surveyorDischarge Planning.   Education Outcome: Acknowledges understanding/In group clarification offered/Needs additional education.   Clinical Observations/Feedback: Pt was quiet but he did identify some coping skills to deal with triggers as understanding other's point of view, communicate and comprehension by getting an understanding of what you're dealing with.   Caroll RancherMarjette Verneda Hollopeter, LRT/CTRS         Caroll RancherLindsay, Isbella Arline A 04/08/2017 12:19 PM

## 2017-04-08 NOTE — Progress Notes (Signed)
[facility] Group Notes:  (Nursing/MHT/Case Management/Adjunct)  Date:  04/08/2017  Time:  1:05 AM  Type of Therapy:  Psychoeducational Skills  Participation Level:  Active  Participation Quality:  Monopolizing  Affect:  Irritable  Cognitive:  Disorganized  Insight:  Limited  Engagement in Group:  Monopolizing  Modes of Intervention:  Discussion  Summary of Progress/Problems: After inquiring about whether or not he could stand and talk, the patient went off on a tangent. He verbalized that he was "waiting on life" and that he needed to take multiple steps in life. Nothing regarding his day was mentioned. The patient's goal for tomorrow is as follows: "start over" and set a number of goals.  Emmelia Holdsworth S 04/08/2017, 1:05 AM
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
## 2017-04-08 NOTE — Plan of Care (Signed)
Problem: Coping: Goal: Ability to demonstrate self-control will improve Outcome: Progressing Pt has remained in control of his behavior tonight.

## 2017-04-09 MED ORDER — ARIPIPRAZOLE 5 MG PO TABS
5.0000 mg | ORAL_TABLET | Freq: Every day | ORAL | Status: DC
  Administered 2017-04-10: 5 mg via ORAL
  Filled 2017-04-09 (×2): qty 1

## 2017-04-09 MED ORDER — DIVALPROEX SODIUM ER 500 MG PO TB24
1000.0000 mg | ORAL_TABLET | Freq: Every day | ORAL | Status: DC
  Administered 2017-04-09: 1000 mg via ORAL
  Filled 2017-04-09 (×2): qty 2

## 2017-04-09 MED ORDER — ARIPIPRAZOLE 10 MG PO TABS
10.0000 mg | ORAL_TABLET | Freq: Every day | ORAL | Status: DC
  Administered 2017-04-09: 10 mg via ORAL
  Filled 2017-04-09 (×2): qty 1

## 2017-04-09 NOTE — Plan of Care (Signed)
Problem: Health Behavior/Discharge Planning: Goal: Compliance with prescribed medication regimen will improve Outcome: Progressing Pt has been compliant with scheduled medications.

## 2017-04-09 NOTE — Progress Notes (Signed)
Recreation Therapy Notes  Date: 04/09/17 Time: 1000 Location: 500 Hall Dayroom  Group Topic: Leisure Education  Goal Area(s) Addresses:  Patient will identify positive leisure activities.  Patient will identify one positive benefit of participation in leisure activities.   Intervention: Holiday representativeConstruction paper, scissors, glue sticks  Activity: Leisure Collage.  Patients were picture themselves as the heads of their own community centers.  Patients were to identify pictures of activities they would offer at their community. Patients were to cut out the pictures and make a collage of the various activities they identified.  Education:  Leisure Education, Building control surveyorDischarge Planning  Education Outcome: Acknowledges education/In group clarification offered/Needs additional education  Clinical Observations/Feedback: Pt did not attend group.   Caroll RancherMarjette Glynna Failla, LRT/CTRS         Lillia AbedLindsay, Brady Plant A 04/09/2017 11:59 AM
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
## 2017-04-09 NOTE — [facility] Group Notes (Signed)
[facility] LCSW Group Therapy  04/09/2017 , 12:39 PM   Type of Therapy:  Group Therapy  Participation Level:  Active  Participation Quality:  Attentive  Affect:  Appropriate  Cognitive:  Alert  Insight:  Improving  Engagement in Therapy:  Engaged  Modes of Intervention:  Discussion, Exploration and Socialization  Summary of Progress/Problems: Today's group focused on the term Diagnosis.  Participants were asked to define the term, and then pronounce whether it is a negative, positive or neutral term. Stayed the entire time, engaged throughout.  Vague, bizarre thoughts.  "You can get support from anybody-homeless people, people with alcohol and drug problems."  Unable to explain further.    Jerry [name], Jerry [name] B 04/09/2017 , 12:39 PM

## 2017-04-09 NOTE — Progress Notes (Signed)
Recreation Therapy Notes  Animal-Assisted Activity (AAA) Program Checklist/Progress Notes Patient Eligibility Criteria Checklist & Daily Group note for Rec Tx Intervention  Date: 05.22.2018 Time: 2:45pm [address]    AAA/T Program Assumption of Risk Form signed by Patient/ or Parent Legal Guardian Yes  Patient is free of allergies or sever asthma Yes  Patient reports no fear of animals Yes  Patient reports no history of cruelty to animals Yes  Patient understands his/her participation is voluntary Yes  Patient washes hands before animal contact Yes  Patient washes hands after animal contact Yes  Behavioral Response: Appropriate    Education: Hand Washing, Appropriate Animal Interaction   Education Outcome: Acknowledges education.   Clinical Observations/Feedback: Patient discussed with MD for appropriateness in pet therapy session. Both LRT and MD agree patient is appropriate for participation. Patient offered participation in session and signed necessary consent form without issue. Despite signing consent form and appearing interested in session, patient changes his mind and informed LRT and Recreation Therapy Intern his name could be removed from the list. Patient presented with pressured speech during interaction.   Marykay Lexenise L Sallie Maker, LRT/CTRS         Pearson Reasons L 04/09/2017 3:20 PM

## 2017-04-09 NOTE — Progress Notes (Signed)
D: Eathan denies SI, HI, and AVH. His behavior has at times been bizarre this shift, He had one outburst while staff members were in a treatment team meeting, during which he began yelling about medications and staff. He stopped, other patients clapped, and no further behavior was noted. About a half hour later, he asked this writer if she had liked his "performance," at which time we had a brief conversation about appropriate behavior.   A: Meds given as ordered. Pt has been compliant with scheduled meds. Q15 safety checks maintained. Support/encouragement offered.  R: Pt remains free from harm and continues with treatment. Will continue to monitor for needs/safety.

## 2017-04-09 NOTE — Progress Notes (Signed)
Nursing Progress Note: 7p-7a D: Pt currently presents with a labile affect and behavior. Pt states "I had an excellent day. I had a hard time staying focused through activities. I really want to go home." Interacting appropriately with milieu. Pt reports good sleep with current medication regimen.   A: Pt provided with medications per providers orders. Pt's labs and vitals were monitored throughout the night. Pt supported emotionally and encouraged to express concerns and questions. Pt educated on medications.  R: Pt's safety ensured with 15 minute and environmental checks. Pt currently denies SI/HI/Self Harm and AVH. Pt verbally contracts to seek staff if SI/HI or A/VH occurs and to consult with staff before acting on any harmful thoughts. Will continue to monitor.

## 2017-04-10 MED ORDER — ARIPIPRAZOLE ER 400 MG IM SRER: 400.0000 mg | INTRAMUSCULAR | 0 refills | Status: DC

## 2017-04-10 MED ORDER — DIVALPROEX SODIUM ER 500 MG PO TB24: 1000.0000 mg | ORAL_TABLET | Freq: Every day | ORAL | 0 refills | Status: DC

## 2017-04-10 MED ORDER — AMANTADINE HCL 100 MG PO CAPS: 100.0000 mg | ORAL_CAPSULE | Freq: Two times a day (BID) | ORAL | 0 refills | Status: DC

## 2017-04-10 MED ORDER — ARIPIPRAZOLE 5 MG PO TABS: 5.0000 mg | ORAL_TABLET | Freq: Every day | ORAL | 0 refills | Status: DC

## 2017-04-10 MED ORDER — NICOTINE 21 MG/24HR TD PT24: 21.0000 mg | MEDICATED_PATCH | Freq: Every day | TRANSDERMAL | 0 refills | Status: DC

## 2017-04-10 MED ORDER — TRAZODONE HCL 50 MG PO TABS: 50.0000 mg | ORAL_TABLET | Freq: Every evening | ORAL | 0 refills | Status: DC | PRN
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
## 2017-04-10 NOTE — Progress Notes (Signed)
Nursing Discharge Note 04/10/2017 [id number]  Data Reports sleeping fair with PRN sleep med.  Rates depression 9/10, hopelessness 8/10, and anxiety 4/10. Affect blunted but appropriate.  Denies HI, SI, AVH.  Appropriate, minimal.  Received discharge orders.  Action Spoke with patient 1:1, nurse offered support to patient throughout shift.  Reviewed medications, discharge instructions, and follow up appointments with patient. Medication  scripts reviewed and given to patient.  Paperwork, AVS, SRA, and transition record handed to patient.   Escorted off of unit at 1039. Belongings returned per belongings form.  Discharged to lobby where he was met by family.    Response Verbalized understanding of discharge teaching. Agrees to contact someone or 911 with thoughts/intent to harm self or others.    To follow up per AVS.
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
## 2017-04-10 NOTE — Plan of Care (Signed)
Problem: [facility] Participation in Recreation Therapeutic Interventions Goal: STG-Patient will demonstrate improved self esteem by identif STG: Self-Esteem - Patient will improve self-esteem, as demonstrated by ability to identify at least 5 positive qualities about him/herself by conclusion of recreation therapy tx  Outcome: Completed/Met Date Met: 04/10/17 Pt was able to identity positive qualities about himself at conclusion of self esteem recreation therapy session.  Victorino Sparrow, LRT/CTRS

## 2017-04-10 NOTE — Progress Notes (Signed)
  [facility] :  Will you be returning to the same living situation after discharge:  No. At discharge, do you have transportation home?: Yes,  TCT [facility] Do you have the ability to pay for your medications: Yes,  mental health, MCD  Release of information consent forms completed and in the chart;  Patient's signature needed at discharge.  Patient to Follow up at: Follow-up Information    [facility] Follow up.   Specialty:  Behavioral Health Why:  Porfirio Oareggie King with TCT will make sure you have a hospital follow up appointment, and a ride to get there.  Call him at [phone] to find out when your next appointment is Contact information: [address] [place] [address] [phone]           Next level of care provider has [facility] to [facility] Link:no  Safety Planning and Suicide Prevention discussed: Yes,  yes  Have you used any form of tobacco in the last 30 days? (Cigarettes, Smokeless Tobacco, Cigars, and/or Pipes): Yes  Has patient been referred to the Quitline?: Patient refused referral  Patient has been referred for addiction treatment: Yes  Jerry [name] 04/10/2017, 9:00 AM

## 2017-04-10 NOTE — Progress Notes (Signed)
Recreation Therapy Notes  Date: 04/10/17 Time: 1000 Location: [address] Dayroom  Group Topic: Wellness  Goal Area(s) Addresses:  Patient will define components of whole wellness. Patient will verbalize benefit of whole wellness.  Intervention:  2 small beach balls, chairs  Activity: Group Juggle.  Patients were seated in a circle.  Patients were to pass the ball back and forth to each other.  Once the the patients got the first ball going in a good rotation, LRT added a second ball.  Patients had to keep both balls going.  The balls could bounce off the floor but the could not come to a complete stop.  Education:Wellness, Discharge Planning.   Education Outcome: Acknowledges education/In group clarification offered/Needs additional education.   Clinical Observations/Feedback:  Pt did not attend group.     Caroll RancherMarjette Taeler Winning, LRT/CTRS         Caroll RancherLindsay, Chazz Philson A 04/10/2017 12:36 PM

## 2017-05-04 ENCOUNTER — Encounter (HOSPITAL_COMMUNITY): Payer: Self-pay | Admitting: Emergency Medicine

## 2017-05-04 ENCOUNTER — Emergency Department (HOSPITAL_COMMUNITY)
Admission: EM | Admit: 2017-05-04 | Discharge: 2017-05-04 | Disposition: A | Discharge status: Home / Self Care | Payer: Medicaid Other | Attending: Emergency Medicine | Admitting: Emergency Medicine

## 2017-05-04 ENCOUNTER — Emergency Department (HOSPITAL_COMMUNITY): Payer: Medicaid Other

## 2017-05-04 DIAGNOSIS — R519 Headache, unspecified: Secondary | ICD-10-CM

## 2017-05-04 DIAGNOSIS — F17210 Nicotine dependence, cigarettes, uncomplicated: Secondary | ICD-10-CM | POA: Insufficient documentation

## 2017-05-04 DIAGNOSIS — G44019 Episodic cluster headache, not intractable: Secondary | ICD-10-CM | POA: Insufficient documentation

## 2017-05-04 DIAGNOSIS — F209 Schizophrenia, unspecified: Secondary | ICD-10-CM | POA: Insufficient documentation

## 2017-05-04 DIAGNOSIS — Z79899 Other long term (current) drug therapy: Secondary | ICD-10-CM | POA: Insufficient documentation

## 2017-05-04 DIAGNOSIS — R51 Headache: Secondary | ICD-10-CM | POA: Diagnosis present

## 2017-05-04 LAB — SALICYLATE LEVEL

## 2017-05-04 LAB — CBC WITH DIFFERENTIAL/PLATELET
BASOS PCT: 0 %
Basophils Absolute: 0 10*3/uL (ref 0.0–0.1)
EOS ABS: 0.2 10*3/uL (ref 0.0–0.7)
Eosinophils Relative: 2 %
HEMATOCRIT: 42.5 % (ref 39.0–52.0)
Hemoglobin: 14.5 g/dL (ref 13.0–17.0)
Lymphocytes Relative: 29 %
Lymphs Abs: 2.9 10*3/uL (ref 0.7–4.0)
MCH: 35.2 pg — AB (ref 26.0–34.0)
MCHC: 34.1 g/dL (ref 30.0–36.0)
MCV: 103.2 fL — AB (ref 78.0–100.0)
MONO ABS: 1 10*3/uL (ref 0.1–1.0)
MONOS PCT: 9 %
Neutro Abs: 6.1 10*3/uL (ref 1.7–7.7)
Neutrophils Relative %: 60 %
Platelets: 208 10*3/uL (ref 150–400)
RBC: 4.12 MIL/uL — ABNORMAL LOW (ref 4.22–5.81)
RDW: 12.9 % (ref 11.5–15.5)
WBC: 10.2 10*3/uL (ref 4.0–10.5)

## 2017-05-04 LAB — COMPREHENSIVE METABOLIC PANEL
ALBUMIN: 3.6 g/dL (ref 3.5–5.0)
ALK PHOS: 76 U/L (ref 38–126)
ALT: 16 U/L — ABNORMAL LOW (ref 17–63)
ANION GAP: 7 (ref 5–15)
AST: 20 U/L (ref 15–41)
BILIRUBIN TOTAL: 0.4 mg/dL (ref 0.3–1.2)
BUN: 8 mg/dL (ref 6–20)
CALCIUM: 9 mg/dL (ref 8.9–10.3)
CO2: 28 mmol/L (ref 22–32)
Chloride: 101 mmol/L (ref 101–111)
Creatinine, Ser: 1.03 mg/dL (ref 0.61–1.24)
Glucose, Bld: 95 mg/dL (ref 65–99)
POTASSIUM: 3.3 mmol/L — AB (ref 3.5–5.1)
Sodium: 136 mmol/L (ref 135–145)
TOTAL PROTEIN: 6.8 g/dL (ref 6.5–8.1)

## 2017-05-04 LAB — RAPID URINE DRUG SCREEN, HOSP PERFORMED
Amphetamines: NOT DETECTED
Barbiturates: NOT DETECTED
Benzodiazepines: NOT DETECTED
Cocaine: NOT DETECTED
OPIATES: NOT DETECTED
Tetrahydrocannabinol: POSITIVE — AB

## 2017-05-04 LAB — ACETAMINOPHEN LEVEL

## 2017-05-04 LAB — ETHANOL: Alcohol, Ethyl (B): <5 mg/dL (ref <5)

## 2017-05-04 MED ORDER — IBUPROFEN 400 MG PO TABS
400.0000 mg | ORAL_TABLET | Freq: Once | ORAL | Status: AC
  Administered 2017-05-04: 400 mg via ORAL

## 2017-05-04 MED ORDER — IBUPROFEN 400 MG PO TABS
ORAL_TABLET | ORAL | Status: AC
  Filled 2017-05-04: qty 1
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
## 2017-05-04 NOTE — ED Notes (Signed)
Patient transported to CT 

## 2017-05-04 NOTE — ED Notes (Signed)
ED Provider at bedside. 

## 2017-05-04 NOTE — Discharge Instructions (Signed)
Vital signs are well within normal limits. The complete blood count and the comprehensive metabolic panel are nonacute. The CT scan of the head shows no acute problem. See your primary physician or your Medicaid access physician if any changes or problems.
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
## 2017-07-01 ENCOUNTER — Emergency Department (HOSPITAL_COMMUNITY): Payer: Medicaid Other

## 2017-07-01 ENCOUNTER — Encounter (HOSPITAL_COMMUNITY): Payer: Self-pay

## 2017-07-01 ENCOUNTER — Emergency Department (HOSPITAL_COMMUNITY)
Admission: EM | Admit: 2017-07-01 | Discharge: 2017-07-01 | Disposition: A | Discharge status: Home / Self Care | Payer: Medicaid Other | Attending: Emergency Medicine | Admitting: Emergency Medicine

## 2017-07-01 DIAGNOSIS — S86812A Strain of other muscle(s) and tendon(s) at lower leg level, left leg, initial encounter: Secondary | ICD-10-CM | POA: Diagnosis not present

## 2017-07-01 DIAGNOSIS — Y929 Unspecified place or not applicable: Secondary | ICD-10-CM | POA: Diagnosis not present

## 2017-07-01 DIAGNOSIS — Y998 Other external cause status: Secondary | ICD-10-CM | POA: Insufficient documentation

## 2017-07-01 DIAGNOSIS — F17210 Nicotine dependence, cigarettes, uncomplicated: Secondary | ICD-10-CM | POA: Insufficient documentation

## 2017-07-01 DIAGNOSIS — Y9367 Activity, basketball: Secondary | ICD-10-CM | POA: Diagnosis not present

## 2017-07-01 DIAGNOSIS — X509XXA Other and unspecified overexertion or strenuous movements or postures, initial encounter: Secondary | ICD-10-CM | POA: Diagnosis not present

## 2017-07-01 DIAGNOSIS — S8992XA Unspecified injury of left lower leg, initial encounter: Secondary | ICD-10-CM | POA: Diagnosis present

## 2017-07-01 DIAGNOSIS — Z79899 Other long term (current) drug therapy: Secondary | ICD-10-CM | POA: Diagnosis not present

## 2017-07-01 MED ORDER — HYDROCODONE-ACETAMINOPHEN 5-325 MG PO TABS
2.0000 | ORAL_TABLET | Freq: Once | ORAL | Status: AC
  Administered 2017-07-01: 2 via ORAL
  Filled 2017-07-01: qty 2

## 2017-07-01 MED ORDER — HYDROCODONE-ACETAMINOPHEN 5-325 MG PO TABS: 1.0000 | ORAL_TABLET | Freq: Four times a day (QID) | ORAL | 0 refills | Status: DC | PRN
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
## 2017-07-01 NOTE — Discharge Instructions (Signed)
Wear knee immobilizer and no weightbearing until followed up by orthopedics.  Follow-up with Dr. Aundria Rudogers in the orthopedic clinic later this week. The contact information for his office has been provided in this discharge summary for you to call and make these arrangements.  Ice for 20 minutes every 2 hours while awake for the next 2 days.  No weightbearing until seen by the orthopedic surgeon.

## 2017-07-01 NOTE — ED Notes (Signed)
Ortho tech paged  

## 2017-07-01 NOTE — Progress Notes (Signed)
Orthopedic Tech Progress Note Patient Details:  Jerry [name] 01/08/1986 [id number]  Ortho Devices Type of Ortho Device: Crutches, Knee Immobilizer Ortho Device/Splint Location: LLE Ortho Device/Splint Interventions: Ordered, Application   Jennye MoccasinHughes, [name] Craig 07/01/2017, 9:41 PM

## 2017-07-01 NOTE — ED Notes (Signed)
Patient Alert and oriented X4. Stable and ambulatory. Patient verbalized understanding of the discharge instructions.  Patient belongings were taken by the patient.  

## 2017-07-01 NOTE — ED Triage Notes (Signed)
While playing basket ball pt felt "pop" in left leg. He now endorses left thigh, knee, and shin pain.

## 2017-07-10 ENCOUNTER — Other Ambulatory Visit: Payer: Self-pay | Admitting: Orthopedic Surgery

## 2017-07-10 ENCOUNTER — Ambulatory Visit
Admission: RE | Admit: 2017-07-10 | Discharge: 2017-07-10 | Disposition: A | Discharge status: Home / Self Care | Payer: Medicaid Other | Source: Ambulatory Visit | Attending: Orthopedic Surgery | Admitting: Orthopedic Surgery

## 2017-07-10 DIAGNOSIS — G8929 Other chronic pain: Secondary | ICD-10-CM

## 2017-07-10 DIAGNOSIS — M25562 Pain in left knee: Principal | ICD-10-CM

## 2017-07-17 ENCOUNTER — Encounter (HOSPITAL_COMMUNITY): Payer: Self-pay | Admitting: *Deleted

## 2017-07-17 NOTE — Progress Notes (Signed)
Pt denies SOB, chest pain, and being under the care of a cardiologist. Pt denies having a stress test, echo and cardiac cath. Pt denies having a chest x ray within the last year. Pt denies recent labs. Pt made aware to stop taking Stop taking Aspirin,vitamins, fish oil and herbal medications. Do not take any NSAIDs ie: Ibuprofen, Advil, Naproxen (Aleve), Motrin, Advil, BC and Goody Powder or any medication containing Aspirin. Pt verbalized understanding of all pre-op instructions,

## 2017-07-19 ENCOUNTER — Ambulatory Visit (HOSPITAL_COMMUNITY): Payer: Medicaid Other | Admitting: Certified Registered"

## 2017-07-19 ENCOUNTER — Encounter (HOSPITAL_COMMUNITY)
Admission: RE | Disposition: A | Discharge status: Home / Self Care | Payer: Self-pay | Source: Ambulatory Visit | Attending: Orthopedic Surgery

## 2017-07-19 ENCOUNTER — Encounter (HOSPITAL_COMMUNITY): Payer: Self-pay

## 2017-07-19 ENCOUNTER — Observation Stay (HOSPITAL_COMMUNITY)
Admission: RE | Admit: 2017-07-19 | Discharge: 2017-07-20 | Disposition: A | Discharge status: Home / Self Care | Payer: Medicaid Other | Source: Ambulatory Visit | Attending: Orthopedic Surgery | Admitting: Orthopedic Surgery

## 2017-07-19 DIAGNOSIS — M25462 Effusion, left knee: Secondary | ICD-10-CM | POA: Insufficient documentation

## 2017-07-19 DIAGNOSIS — Z7982 Long term (current) use of aspirin: Secondary | ICD-10-CM | POA: Insufficient documentation

## 2017-07-19 DIAGNOSIS — Z79899 Other long term (current) drug therapy: Secondary | ICD-10-CM | POA: Insufficient documentation

## 2017-07-19 DIAGNOSIS — F209 Schizophrenia, unspecified: Secondary | ICD-10-CM | POA: Diagnosis not present

## 2017-07-19 DIAGNOSIS — Y9367 Activity, basketball: Secondary | ICD-10-CM | POA: Insufficient documentation

## 2017-07-19 DIAGNOSIS — Z888 Allergy status to other drugs, medicaments and biological substances status: Secondary | ICD-10-CM | POA: Diagnosis not present

## 2017-07-19 DIAGNOSIS — F319 Bipolar disorder, unspecified: Secondary | ICD-10-CM | POA: Insufficient documentation

## 2017-07-19 DIAGNOSIS — S76112A Strain of left quadriceps muscle, fascia and tendon, initial encounter: Secondary | ICD-10-CM | POA: Diagnosis not present

## 2017-07-19 DIAGNOSIS — F17210 Nicotine dependence, cigarettes, uncomplicated: Secondary | ICD-10-CM | POA: Insufficient documentation

## 2017-07-19 DIAGNOSIS — S86812A Strain of other muscle(s) and tendon(s) at lower leg level, left leg, initial encounter: Secondary | ICD-10-CM | POA: Diagnosis present

## 2017-07-19 DIAGNOSIS — X58XXXA Exposure to other specified factors, initial encounter: Secondary | ICD-10-CM | POA: Diagnosis not present

## 2017-07-19 HISTORY — PX: PATELLAR TENDON REPAIR: SHX737

## 2017-07-19 HISTORY — DX: Headache: R51

## 2017-07-19 HISTORY — DX: Unspecified convulsions: R56.9

## 2017-07-19 HISTORY — DX: Accidental discharge from unspecified firearms or gun, initial encounter: W34.00XA

## 2017-07-19 HISTORY — DX: Headache, unspecified: R51.9

## 2017-07-19 HISTORY — DX: Strain of other muscle(s) and tendon(s) at lower leg level, unspecified leg, initial encounter: S86.819A

## 2017-07-19 LAB — CBC
HCT: 42.3 % (ref 39.0–52.0)
HCT: 40.5 % (ref 39.0–52.0)
Hemoglobin: 14.3 g/dL (ref 13.0–17.0)
Hemoglobin: 13.7 g/dL (ref 13.0–17.0)
MCH: 34.5 pg — ABNORMAL HIGH (ref 26.0–34.0)
MCH: 34.5 pg — ABNORMAL HIGH (ref 26.0–34.0)
MCHC: 33.8 g/dL (ref 30.0–36.0)
MCHC: 33.8 g/dL (ref 30.0–36.0)
MCV: 101.9 fL — ABNORMAL HIGH (ref 78.0–100.0)
MCV: 102 fL — AB (ref 78.0–100.0)
PLATELETS: 245 10*3/uL (ref 150–400)
Platelets: 257 10*3/uL (ref 150–400)
RBC: 4.15 MIL/uL — ABNORMAL LOW (ref 4.22–5.81)
RBC: 3.97 MIL/uL — ABNORMAL LOW (ref 4.22–5.81)
RDW: 12.6 % (ref 11.5–15.5)
RDW: 12.5 % (ref 11.5–15.5)
WBC: 7.9 10*3/uL (ref 4.0–10.5)
WBC: 9.6 10*3/uL (ref 4.0–10.5)

## 2017-07-19 LAB — BASIC METABOLIC PANEL
Anion gap: 11 (ref 5–15)
BUN: 11 mg/dL (ref 6–20)
CHLORIDE: 103 mmol/L (ref 101–111)
CO2: 23 mmol/L (ref 22–32)
Calcium: 8.9 mg/dL (ref 8.9–10.3)
Creatinine, Ser: 1.01 mg/dL (ref 0.61–1.24)
GFR calc non Af Amer: >60 mL/min (ref >60)
GLUCOSE: 72 mg/dL (ref 65–99)
POTASSIUM: 4 mmol/L (ref 3.5–5.1)
SODIUM: 137 mmol/L (ref 135–145)

## 2017-07-19 LAB — CREATININE, SERUM: Creatinine, Ser: 1.15 mg/dL (ref 0.61–1.24)

## 2017-07-19 SURGERY — REPAIR, TENDON, PATELLAR
Anesthesia: General | Site: Knee | Laterality: Left

## 2017-07-19 MED ORDER — METHOCARBAMOL 1000 MG/10ML IJ SOLN
500.0000 mg | Freq: Four times a day (QID) | INTRAMUSCULAR | Status: DC | PRN
  Filled 2017-07-19: qty 5

## 2017-07-19 MED ORDER — HYDROMORPHONE HCL 1 MG/ML IJ SOLN
INTRAMUSCULAR | Status: AC
  Filled 2017-07-19: qty 1

## 2017-07-19 MED ORDER — FENTANYL CITRATE (PF) 250 MCG/5ML IJ SOLN
INTRAMUSCULAR | Status: AC
  Filled 2017-07-19: qty 5

## 2017-07-19 MED ORDER — SODIUM CHLORIDE 0.9 % IR SOLN
Status: DC | PRN
  Administered 2017-07-19: 1000 mL

## 2017-07-19 MED ORDER — LIDOCAINE 2% (20 MG/ML) 5 ML SYRINGE
INTRAMUSCULAR | Status: AC
  Filled 2017-07-19: qty 5

## 2017-07-19 MED ORDER — HYDROMORPHONE HCL 1 MG/ML IJ SOLN
0.2500 mg | INTRAMUSCULAR | Status: DC | PRN
  Administered 2017-07-19 (×4): 0.5 mg via INTRAVENOUS

## 2017-07-19 MED ORDER — FENTANYL CITRATE (PF) 100 MCG/2ML IJ SOLN
INTRAMUSCULAR | Status: DC | PRN
  Administered 2017-07-19 (×5): 50 ug via INTRAVENOUS

## 2017-07-19 MED ORDER — OXYCODONE HCL 5 MG PO TABS
5.0000 mg | ORAL_TABLET | Freq: Once | ORAL | Status: AC | PRN
  Administered 2017-07-19: 5 mg via ORAL

## 2017-07-19 MED ORDER — METHOCARBAMOL 500 MG PO TABS
500.0000 mg | ORAL_TABLET | Freq: Four times a day (QID) | ORAL | Status: DC | PRN
  Administered 2017-07-19 – 2017-07-20 (×2): 500 mg via ORAL
  Filled 2017-07-19 (×2): qty 1

## 2017-07-19 MED ORDER — CHLORHEXIDINE GLUCONATE 4 % EX LIQD: 60.0000 mL | Freq: Once | CUTANEOUS | Status: DC

## 2017-07-19 MED ORDER — ROCURONIUM BROMIDE 100 MG/10ML IV SOLN
INTRAVENOUS | Status: DC | PRN
  Administered 2017-07-19: 50 mg via INTRAVENOUS

## 2017-07-19 MED ORDER — OXYCODONE HCL 5 MG PO TABS
5.0000 mg | ORAL_TABLET | ORAL | Status: DC | PRN
  Administered 2017-07-19 – 2017-07-20 (×5): 10 mg via ORAL
  Filled 2017-07-19 (×5): qty 2

## 2017-07-19 MED ORDER — MIDAZOLAM HCL 2 MG/2ML IJ SOLN
INTRAMUSCULAR | Status: AC
  Filled 2017-07-19: qty 2

## 2017-07-19 MED ORDER — ONDANSETRON HCL 4 MG/2ML IJ SOLN: 4.0000 mg | Freq: Four times a day (QID) | INTRAMUSCULAR | Status: DC | PRN

## 2017-07-19 MED ORDER — SENNA 8.6 MG PO TABS
1.0000 | ORAL_TABLET | Freq: Two times a day (BID) | ORAL | Status: DC
  Administered 2017-07-19 – 2017-07-20 (×2): 8.6 mg via ORAL
  Filled 2017-07-19 (×2): qty 1

## 2017-07-19 MED ORDER — LACTATED RINGERS IV SOLN
INTRAVENOUS | Status: DC
  Administered 2017-07-19 (×2): via INTRAVENOUS

## 2017-07-19 MED ORDER — ONDANSETRON HCL 4 MG PO TABS: 4.0000 mg | ORAL_TABLET | Freq: Four times a day (QID) | ORAL | Status: DC | PRN

## 2017-07-19 MED ORDER — AMANTADINE HCL 100 MG PO CAPS
100.0000 mg | ORAL_CAPSULE | Freq: Two times a day (BID) | ORAL | Status: DC
  Administered 2017-07-19 – 2017-07-20 (×2): 100 mg via ORAL
  Filled 2017-07-19 (×2): qty 1

## 2017-07-19 MED ORDER — OXYCODONE HCL 5 MG/5ML PO SOLN: 5.0000 mg | Freq: Once | ORAL | Status: AC | PRN

## 2017-07-19 MED ORDER — DIVALPROEX SODIUM ER 500 MG PO TB24
1000.0000 mg | ORAL_TABLET | Freq: Every day | ORAL | Status: DC
  Administered 2017-07-19: 1000 mg via ORAL
  Filled 2017-07-19: qty 2

## 2017-07-19 MED ORDER — HYDROMORPHONE HCL 1 MG/ML IJ SOLN
INTRAMUSCULAR | Status: AC
Start: 2017-07-19 — End: 2017-07-20
  Filled 2017-07-19: qty 1

## 2017-07-19 MED ORDER — PROPOFOL 10 MG/ML IV BOLUS
INTRAVENOUS | Status: DC | PRN
  Administered 2017-07-19: 200 mg via INTRAVENOUS

## 2017-07-19 MED ORDER — FENTANYL CITRATE (PF) 100 MCG/2ML IJ SOLN
100.0000 ug | Freq: Once | INTRAMUSCULAR | Status: AC
  Administered 2017-07-19: 100 ug via INTRAVENOUS
  Filled 2017-07-19: qty 2

## 2017-07-19 MED ORDER — ROPIVACAINE HCL 7.5 MG/ML IJ SOLN
INTRAMUSCULAR | Status: DC | PRN
  Administered 2017-07-19: 20 mL via PERINEURAL

## 2017-07-19 MED ORDER — ARIPIPRAZOLE 5 MG PO TABS
5.0000 mg | ORAL_TABLET | Freq: Every day | ORAL | Status: DC
  Administered 2017-07-20: 5 mg via ORAL
  Filled 2017-07-19: qty 1

## 2017-07-19 MED ORDER — ACETAMINOPHEN 325 MG PO TABS: 650.0000 mg | ORAL_TABLET | Freq: Four times a day (QID) | ORAL | Status: DC | PRN

## 2017-07-19 MED ORDER — SUCCINYLCHOLINE CHLORIDE 20 MG/ML IJ SOLN
INTRAMUSCULAR | Status: DC | PRN
  Administered 2017-07-19: 120 mg via INTRAVENOUS

## 2017-07-19 MED ORDER — MIDAZOLAM HCL 2 MG/2ML IJ SOLN
2.0000 mg | Freq: Once | INTRAMUSCULAR | Status: AC
  Administered 2017-07-19: 1 mg via INTRAVENOUS
  Filled 2017-07-19: qty 2

## 2017-07-19 MED ORDER — ENOXAPARIN SODIUM 40 MG/0.4ML ~~LOC~~ SOLN
40.0000 mg | SUBCUTANEOUS | Status: DC
  Administered 2017-07-20: 40 mg via SUBCUTANEOUS
  Filled 2017-07-19: qty 0.4

## 2017-07-19 MED ORDER — ACETAMINOPHEN 650 MG RE SUPP: 650.0000 mg | Freq: Four times a day (QID) | RECTAL | Status: DC | PRN

## 2017-07-19 MED ORDER — ONDANSETRON HCL 4 MG/2ML IJ SOLN
INTRAMUSCULAR | Status: DC | PRN
  Administered 2017-07-19: 4 mg via INTRAVENOUS

## 2017-07-19 MED ORDER — CEFAZOLIN SODIUM-DEXTROSE 2-4 GM/100ML-% IV SOLN
2.0000 g | INTRAVENOUS | Status: AC
  Administered 2017-07-19: 2 g via INTRAVENOUS
  Filled 2017-07-19: qty 100

## 2017-07-19 MED ORDER — SUGAMMADEX SODIUM 200 MG/2ML IV SOLN
INTRAVENOUS | Status: DC | PRN
  Administered 2017-07-19: 200 mg via INTRAVENOUS

## 2017-07-19 MED ORDER — LIDOCAINE 2% (20 MG/ML) 5 ML SYRINGE
INTRAMUSCULAR | Status: DC | PRN
  Administered 2017-07-19: 100 mg via INTRAVENOUS

## 2017-07-19 MED ORDER — OXYCODONE HCL 5 MG PO TABS
ORAL_TABLET | ORAL | Status: AC
  Filled 2017-07-19: qty 1

## 2017-07-19 MED ORDER — PROPOFOL 10 MG/ML IV BOLUS
INTRAVENOUS | Status: AC
Start: 2017-07-19 — End: ?
  Filled 2017-07-19: qty 20

## 2017-07-19 MED ORDER — MORPHINE SULFATE (PF) 4 MG/ML IV SOLN
2.0000 mg | INTRAVENOUS | Status: DC | PRN
  Administered 2017-07-19 (×2): 2 mg via INTRAVENOUS
  Filled 2017-07-19 (×2): qty 1

## 2017-07-19 MED ORDER — PROPOFOL 10 MG/ML IV BOLUS
INTRAVENOUS | Status: AC
  Filled 2017-07-19: qty 20

## 2017-07-19 MED ORDER — TRAZODONE HCL 50 MG PO TABS
50.0000 mg | ORAL_TABLET | Freq: Every evening | ORAL | Status: DC | PRN
  Administered 2017-07-19: 50 mg via ORAL
  Filled 2017-07-19: qty 1

## 2017-07-19 SURGICAL SUPPLY — 48 items
BNDG CMPR MED 15X6 ELC VLCR LF (GAUZE/BANDAGES/DRESSINGS) ×1
BNDG COHESIVE 4X5 TAN STRL (GAUZE/BANDAGES/DRESSINGS) ×2 IMPLANT
BNDG ELASTIC 6X15 VLCR STRL LF (GAUZE/BANDAGES/DRESSINGS) ×2 IMPLANT
COVER SURGICAL LIGHT HANDLE (MISCELLANEOUS) ×3 IMPLANT
CUFF TOURNIQUET SINGLE 34IN LL (TOURNIQUET CUFF) ×4 IMPLANT
DRAPE U-SHAPE 47X51 STRL (DRAPES) ×3 IMPLANT
DRSG EMULSION OIL 3X3 NADH (GAUZE/BANDAGES/DRESSINGS) ×2 IMPLANT
DURAPREP 26ML APPLICATOR (WOUND CARE) ×3 IMPLANT
ELECT CAUTERY BLADE 6.4 (BLADE) ×3 IMPLANT
ELECT REM PT RETURN 9FT ADLT (ELECTROSURGICAL) ×3
ELECTRODE REM PT RTRN 9FT ADLT (ELECTROSURGICAL) IMPLANT
GAUZE SPONGE 4X4 12PLY STRL LF (GAUZE/BANDAGES/DRESSINGS) ×2 IMPLANT
GLOVE BIO SURGEON STRL SZ7.5 (GLOVE) ×5 IMPLANT
GLOVE BIOGEL PI IND STRL 7.5 (GLOVE) IMPLANT
GLOVE BIOGEL PI IND STRL 8 (GLOVE) ×1 IMPLANT
GLOVE BIOGEL PI INDICATOR 7.5 (GLOVE) ×2
GLOVE BIOGEL PI INDICATOR 8 (GLOVE) ×4
GLOVE ECLIPSE 6.5 STRL STRAW (GLOVE) ×2 IMPLANT
GLOVE SURG SS PI 6.5 STRL IVOR (GLOVE) ×2 IMPLANT
GOWN STRL REUS W/ TWL LRG LVL3 (GOWN DISPOSABLE) ×2 IMPLANT
GOWN STRL REUS W/ TWL XL LVL3 (GOWN DISPOSABLE) ×1 IMPLANT
GOWN STRL REUS W/TWL LRG LVL3 (GOWN DISPOSABLE) ×6
GOWN STRL REUS W/TWL XL LVL3 (GOWN DISPOSABLE) ×3
KIT BASIN OR (CUSTOM PROCEDURE TRAY) ×3 IMPLANT
KIT ROOM TURNOVER OR (KITS) ×3 IMPLANT
NDL TAPERED W/ NITINOL LOOP (MISCELLANEOUS) IMPLANT
NEEDLE TAPERED W/ NITINOL LOOP (MISCELLANEOUS) ×3 IMPLANT
NS IRRIG 1000ML POUR BTL (IV SOLUTION) ×4 IMPLANT
PACK ORTHO EXTREMITY (CUSTOM PROCEDURE TRAY) ×3 IMPLANT
PAD ABD 8X10 STRL (GAUZE/BANDAGES/DRESSINGS) ×2 IMPLANT
PAD ARMBOARD 7.5X6 YLW CONV (MISCELLANEOUS) ×4 IMPLANT
PADDING CAST COTTON 6X4 STRL (CAST SUPPLIES) ×2 IMPLANT
STAPLER VISISTAT 35W (STAPLE) ×2 IMPLANT
STOCKINETTE IMPERVIOUS 9X36 MD (GAUZE/BANDAGES/DRESSINGS) ×3 IMPLANT
SUT VIC AB 0 CT1 27 (SUTURE) ×6
SUT VIC AB 0 CT1 27XBRD ANBCTR (SUTURE) IMPLANT
SUT VIC AB 2-0 CT1 27 (SUTURE) ×6
SUT VIC AB 2-0 CT1 TAPERPNT 27 (SUTURE) IMPLANT
SUTURE TAPE 1.3 40 TPR END (SUTURE) IMPLANT
SUTURETAPE 1.3 40 TPR END (SUTURE) ×12
SYS INTERNAL BRACE KNEE (Miscellaneous) ×6 IMPLANT
SYSTEM INTERNAL BRACE KNEE (Miscellaneous) IMPLANT
TOWEL OR 17X24 6PK STRL BLUE (TOWEL DISPOSABLE) ×3 IMPLANT
TUBE CONNECTING 12'X1/4 (SUCTIONS) ×1
TUBE CONNECTING 12X1/4 (SUCTIONS) ×2 IMPLANT
UNDERPAD 30X30 (UNDERPADS AND DIAPERS) ×2 IMPLANT
WATER STERILE IRR 1000ML POUR (IV SOLUTION) ×1 IMPLANT
YANKAUER SUCT BULB TIP NO VENT (SUCTIONS) ×3 IMPLANT

## 2017-07-19 NOTE — Discharge Instructions (Signed)
-   partial weight bearing only to left leg, use crutches - maintain your brace at all times, including for sleep - maintain post op dressing for 3 days.  Then remove and replace with clean dry bandages, until you follow up those should be changed once per day. - Do not get your bandages wet - for prevention of blood clots take a 325 mg aspirin daily for 6 weeks. - return to see Dr. Aundria Rudogers in 2 weeks for a wound check

## 2017-07-19 NOTE — Anesthesia Procedure Notes (Signed)
Anesthesia Regional Block: Adductor canal block   Pre-Anesthetic Checklist: ,, timeout performed, Correct Patient, Correct Site, Correct Laterality, Correct Procedure, Correct Position, site marked, Risks and benefits discussed,  Surgical consent,  Pre-op evaluation,  At surgeon's request and post-op pain management  Laterality: Left and Lower  Prep: chloraprep       Needles:   Needle Type: Echogenic Stimulator Needle     Needle Length: 9cm  Needle Gauge: 21   Needle insertion depth: 6 cm   Additional Needles:   Procedures: ultrasound guided,,,,,,,,  Narrative:  Start time: 07/19/2017 12:15 PM End time: 07/19/2017 12:29 PM Injection made incrementally with aspirations every 5 mL.  Performed by: Personally  Anesthesiologist: Christl Fessenden

## 2017-07-19 NOTE — Anesthesia Preprocedure Evaluation (Addendum)
Anesthesia Evaluation  Patient identified by MRN, date of birth, ID band Patient awake    Reviewed: Allergy & Precautions, NPO status , Patient's Chart, lab work & pertinent test results  Airway Mallampati: II  TM Distance: >3 FB Neck ROM: Full    Dental no notable dental hx.    Pulmonary Current Smoker,    breath sounds clear to auscultation       Cardiovascular negative cardio ROS   Rhythm:Regular Rate:Normal     Neuro/Psych  Headaches, Seizures -,  Bipolar Disorder Schizophrenia    GI/Hepatic negative GI ROS, Neg liver ROS, (+)     substance abuse  ,   Endo/Other  negative endocrine ROS  Renal/GU negative Renal ROS     Musculoskeletal   Abdominal   Peds  Hematology negative hematology ROS (+)   Anesthesia Other Findings   Reproductive/Obstetrics                           Lab Results  Component Value Date   WBC 10.2 05/04/2017   HGB 14.5 05/04/2017   HCT 42.5 05/04/2017   MCV 103.2 (H) 05/04/2017   PLT 208 05/04/2017   Lab Results  Component Value Date   CREATININE 1.03 05/04/2017   BUN 8 05/04/2017   NA 136 05/04/2017   K 3.3 (L) 05/04/2017   CL 101 05/04/2017   CO2 28 05/04/2017    Anesthesia Physical Anesthesia Plan  ASA: II  Anesthesia Plan: General   Post-op Pain Management:  Regional for Post-op pain   Induction: Intravenous  PONV Risk Score and Plan:   Airway Management Planned: LMA  Additional Equipment:   Intra-op Plan:   Post-operative Plan: Extubation in OR  Informed Consent: I have reviewed the patients History and Physical, chart, labs and discussed the procedure including the risks, benefits and alternatives for the proposed anesthesia with the patient or authorized representative who has indicated his/her understanding and acceptance.   Dental advisory given  Plan Discussed with: CRNA  Anesthesia Plan Comments:         Anesthesia  Quick Evaluation

## 2017-07-19 NOTE — Brief Op Note (Signed)
07/19/2017  2:40 PM  PATIENT:  Jerry [name]  31 y.o. male  PRE-OPERATIVE DIAGNOSIS:  Left knee patella rupture  POST-OPERATIVE DIAGNOSIS:  Left knee patella rupture  PROCEDURE:  Procedure(s) with comments: LEFT PATELLA TENDON REPAIR (Left) - 90 mins  SURGEON:  Surgeon(s) and Role:    * Aundria Rudogers, Noah DelaineJason Patrick, MD - Primary  PHYSICIAN ASSISTANT:   ASSISTANTS: Patrick Jupiterarla Bethune, RNFA   ANESTHESIA:   regional and general  EBL:  Total I/O In: 1000 [I.V.:1000] Out: 25 [Blood:25]  BLOOD ADMINISTERED:none  DRAINS: none   LOCAL MEDICATIONS USED:  NONE  SPECIMEN:  No Specimen  DISPOSITION OF SPECIMEN:  N/A  COUNTS:  YES  TOURNIQUET:  * Missing tourniquet times found for documented tourniquets in log:  [id number] *  DICTATION: .Note written in EPIC  PLAN OF CARE: Admit for overnight observation  PATIENT DISPOSITION:  PACU - hemodynamically stable.   Delay start of Pharmacological VTE agent (>24hrs) due to surgical blood loss or risk of bleeding: not applicable
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
## 2017-07-19 NOTE — Transfer of Care (Signed)
Immediate Anesthesia Transfer of Care Note  Patient: Jerry [name]  Procedure(s) Performed: Procedure(s) with comments: LEFT PATELLA TENDON REPAIR (Left) - 90 mins  Patient Location: PACU  Anesthesia Type:General  Level of Consciousness: awake, alert  and oriented  Airway & Oxygen Therapy: Patient Spontanous Breathing and Patient connected to nasal cannula oxygen  Post-op Assessment: Report given to RN and Post -op Vital signs reviewed and stable  Post vital signs: Reviewed and stable  Last Vitals:  Vitals:   07/19/17 1034  BP: 129/66  Pulse: (!) 59  Resp: 18  Temp: 36.8 C  SpO2: 100%    Last Pain:  Vitals:   07/19/17 1053  TempSrc:   PainSc: 8       Patients Stated Pain Goal: 5 (07/19/17 1053)  Complications: No apparent anesthesia complications

## 2017-07-19 NOTE — Anesthesia Postprocedure Evaluation (Signed)
Anesthesia Post Note  Patient: Jerry [name]  Procedure(s) Performed: Procedure(s) (LRB): LEFT PATELLA TENDON REPAIR (Left)     Patient location during evaluation: PACU Anesthesia Type: General Level of consciousness: awake and sedated Pain management: pain level controlled Vital Signs Assessment: post-procedure vital signs reviewed and stable Respiratory status: spontaneous breathing, nonlabored ventilation, respiratory function stable and patient connected to nasal cannula oxygen Cardiovascular status: blood pressure returned to baseline and stable Postop Assessment: no signs of nausea or vomiting Anesthetic complications: no    Last Vitals:  Vitals:   07/19/17 1510 07/19/17 1525  BP: (!) 161/86 (!) 159/86  Pulse: 88 79  Resp: 15 15  Temp:    SpO2: 97% 96%    Last Pain:  Vitals:   07/19/17 1510  TempSrc:   PainSc: 7                  Annlee Glandon,JAMES TERRILL

## 2017-07-19 NOTE — Anesthesia Procedure Notes (Signed)
Procedure Name: Intubation Date/Time: 07/19/2017 1:11 PM Performed by: Clearnce Sorrel Pre-anesthesia Checklist: Patient identified, Emergency Drugs available, Suction available, Patient being monitored and Timeout performed Patient Re-evaluated:Patient Re-evaluated prior to induction Oxygen Delivery Method: Circle system utilized Preoxygenation: Pre-oxygenation with 100% oxygen Induction Type: IV induction Ventilation: Mask ventilation without difficulty Laryngoscope Size: Mac and 3 Grade View: Grade I Tube type: Oral Tube size: 7.5 mm Number of attempts: 1 Airway Equipment and Method: Stylet Placement Confirmation: ETT inserted through vocal cords under direct vision,  positive ETCO2 and breath sounds checked- equal and bilateral Secured at: 23 cm Tube secured with: Tape Dental Injury: Teeth and Oropharynx as per pre-operative assessment

## 2017-07-20 DIAGNOSIS — S76112A Strain of left quadriceps muscle, fascia and tendon, initial encounter: Secondary | ICD-10-CM | POA: Diagnosis not present

## 2017-07-20 LAB — HIV ANTIBODY (ROUTINE TESTING W REFLEX): HIV Screen 4th Generation wRfx: NONREACTIVE

## 2017-07-20 MED ORDER — OXYCODONE HCL 5 MG PO TABS: 5.0000 mg | ORAL_TABLET | ORAL | 0 refills | Status: DC | PRN

## 2017-07-20 MED ORDER — ONDANSETRON 4 MG PO TBDP
4.0000 mg | ORAL_TABLET | Freq: Three times a day (TID) | ORAL | 0 refills | Status: DC | PRN
Start: 2017-07-20 — End: 2022-04-10

## 2017-07-20 MED ORDER — ASPIRIN EC 325 MG PO TBEC: 325.0000 mg | DELAYED_RELEASE_TABLET | Freq: Two times a day (BID) | ORAL | 0 refills | Status: AC

## 2017-07-20 MED ORDER — METHOCARBAMOL 500 MG PO TABS: 500.0000 mg | ORAL_TABLET | Freq: Four times a day (QID) | ORAL | 0 refills | Status: DC | PRN

## 2017-07-20 NOTE — Care Management Note (Signed)
Case Management Note  Patient Details  Name: Jerry [name] Detloff MRN: [id number] Date of Birth: 05/11/1986  Subjective/Objective:                 Patient with order to DC to home today. Chart reviewed. No Home Health or Equipment needs, no unacknowledged Case Management consults or medication needs identified at the time of this note. Plan for DC to home. If needs arise today prior to discharge, please call Lawerance SabalDebbie Marielena Harvell RN CM at [phone].    Action/Plan:   Expected Discharge Date:  07/20/17               Expected Discharge Plan:  Home/Self Care  In-House Referral:     Discharge planning Services  CM Consult  Post Acute Care Choice:    Choice offered to:     DME Arranged:    DME Agency:     HH Arranged:    HH Agency:     Status of Service:  Completed, signed off  If discussed at [employer] Length of Stay Meetings, dates discussed:    Additional Comments:  Lawerance SabalDebbie Drystan Reader, RN 07/20/2017, 11:13 AM

## 2017-07-20 NOTE — Progress Notes (Signed)
   Subjective:  Patient reports pain as moderate.  Pain managed with pain meds.  Pt denies any numbness, nausea/vomiting, SOB, CP or NS, chills.  Objective:   VITALS:   Vitals:   07/19/17 1610 07/19/17 1620 07/19/17 2330 07/20/17 0608  BP: (!) 145/81 (!) 143/75 140/83 131/72  Pulse: 82 91 81 69  Resp: 17 16    Temp: (!) 97.3 F (36.3 C) 98.4 F (36.9 C) 99.3 F (37.4 C) 99.4 F (37.4 C)  TempSrc:  Oral Oral Oral  SpO2: 98% 95% 98% 98%  Weight:      Height:       LLE-  bandages c/d/i, no drainage, Bledsoe brace in place locked at 0 -distally SILT dp/sp/sur/saph/TN - motor intact distally with TA/GSC/FHL/EHL -2 + DP No pain with passive stretch, compartments soft  Lab Results  Component Value Date   WBC 9.6 07/19/2017   HGB 13.7 07/19/2017   HCT 40.5 07/19/2017   MCV 102.0 (H) 07/19/2017   PLT 245 07/19/2017   BMET    Component Value Date/Time   NA 137 07/19/2017 1033   K 4.0 07/19/2017 1033   CL 103 07/19/2017 1033   CO2 23 07/19/2017 1033   GLUCOSE 72 07/19/2017 1033   BUN 11 07/19/2017 1033   CREATININE 1.15 07/19/2017 1655   CALCIUM 8.9 07/19/2017 1033   GFRNONAA >60 07/19/2017 1655   GFRAA >60 07/19/2017 1655     Assessment/Plan: 1 Day Post-Op   Active Problems:   Patellar tendon rupture, left, initial encounter  -- 25% WB to LLE - maintain knee brace in extension at all times - lovenox and SCD in house for DVT ppx, dc home on bid asa - will keep another night for pain control and therapy - regular diet - pain control with POs and IV for breakthrough  Yolonda KidaJason Patrick Rogers 07/20/2017, 9:20 AM   Maryan RuedJason P Rogers, MD [phone]

## 2017-07-22 NOTE — Op Note (Addendum)
Date of Surgery: 07/19/17  INDICATIONS: [name] is a 31 y.o.-year-old male with a left Patella tendon rupture;  Mr. Germaine PomfretCorder sustained a left patella tendon rupture while playing basketball on 13 August.  He was evaluated in the emergency department at that time and placed in a knee immobilizer.  He presented to my clinic were we have recommended getting an MRI for evaluation however due to retained bone fragments he was unable to get up MRI.  We did get a CT scan that confirmed patella tendon rupture and also indicated that it was a mid substance rupture of the left patella tendon.  On examination and findings consistent with that finding and inability to perform a straight leg raise.  We recommended operative fixation to reestablish his extensor mechanism of the left knee.  We discussed the risks benefits and indications of the procedure including but not limited to bleeding, infection, damage to neurovascular structures, potential development of femoral arthrosis, blood clots, need for future surgery, and the risk of anesthesia.  The patient did consent to the procedure after discussion of the risks and benefits.  PREOPERATIVE DIAGNOSIS: Left mid substance patella tendon rupture  POSTOPERATIVE DIAGNOSIS: Same.  PROCEDURE: Left patella tendon repair  SURGEON: Maryan RuedJason P Jaelah Hauth, M.D.  ASSIST: Patrick Jupiterarla Bethune, RNFA.Marland Kitchen.  ANESTHESIA:  general  IV FLUIDS AND URINE: See anesthesia.  ESTIMATED BLOOD LOSS: 30 mL.  IMPLANTS: Arthrex 4.75 mm bio composite swivel lock anchor x 4 For internal brace  DRAINS: None  Tourniquet: 73 minutes at 250 mmHg  COMPLICATIONS: None.  DESCRIPTION OF PROCEDURE: The patient was brought to the operating room and placed Supine on the operating table.  The patient had been signed prior to the procedure and this was documented. The patient had the anesthesia placed by the anesthesiologist.  A time-out was performed to confirm that this was the correct patient, site, side and  location. The patient did receive antibiotics prior to the incision and was re-dosed during the procedure as needed at indicated intervals.  A tourniquet Was placed.  The patient had the operative extremity prepped and draped in the standard surgical fashion.     After adequate general anesthesia was obtained the left lower extremity was prepped and draped in standard sterile fashion.  Gravity exsanguination was undertaken and the tourniquet was inflated.  A midline incision was utilized over the left knee.  Dissection was carried down through skin septae is tissue and a medial and lateral flaps were developed.  There was noted to be a transverse rupture of the patella in the mid substance.  There were equal parts of the patella tendon proximally and distally about 5 cm each direction.  There was a transverse rent medial and lateral retinaculum adjacent to the tendon rupture.  There was marked scarring and fibrotic tissue noted through the zone of injury.  This was released completely on the medial lateral side as well as superior and inferiorly.  Once the releases were completed up to the level of the quad tendon the patella and proximal stump were noted to have good excursion and easily able to be brought down to the distal stump.  We next began the repair utilizing the Arthrex suture tape sutures.  The distal stump had 2 sutures passed in a Krakw fashion moving from proximal to distal, with 5 throws in each direction.  This was repeated on the medial and lateral half of the distal stump.  Next the proximal stump was controlled in a similar fashion utilizing  2 separate suture tape sutures.  Again starting in a distal segment and moving proximally 5 Running locking [employer] were thrown in each direction.  This was repeated on the medial and lateral half of the tendon.  Next we prepared for the internal brace component of the procedure.  In the inferior pole of the patella to drill pins were placed into  the bone.  Care was taken to ensure that we did not exit through the inferior surface of the articular patella, During the drill pin placement as well as the reaming and tapping steps.  Next we overdrilled this as per the manufacturer's guidelines and then tapped to 20 mm on each socket.  Next a preloaded fiber tape swivel lock anchor was placed in each Respective drill socket. Next we completed the tendon repair by tying the free ends of the proximal suture tapes to the free ends of the distal suture tapes.  This brought the patella tendon into good apposition with no gapping.  The free ends of the suture tapes were cut.  Next we used an 0 Vicryl performed a running repair of the retinaculum as well as peritenon.  Finally we then moved to secure our internal brace.  This required placing 2 more swivel lock anchors into the tibial tubercle just adjacent to the distal patella attachment.  The same technique as in the patella we placed a drill pin, then reamed per the manufacturers guideline, and then tapped to the appropriate depth of 20 mm.  The free ends of the inferior patella fiber tapes were passed through the free swivel locks and then placed into the tibial tubercle with excellent fixation thus neutralizing our repair and removing any opportunity for undue stress or gapping across The patella tendon repair site.  The knee was easily able to be ranged to about 90 Of flexion without any gapping at the repair site.   The wound was copiously irrigated with normal saline.  We closed the wound in a layered fashion with a 0 Vicryl For subcutaneous fat, 2-0 Vicryl for deep dermis and a skin stapler for skin.  A standard sterile dressing was applied.  Once the drapes were removed the knee was placed and a T ROM knee immobilizer locked in extension.   All counts were correct at the end of the procedure 2.  There were no immediate complications.  The patient was extubated in the operating room and there were no  complications.  He was transferred to PACU in stable condition.  POSTOPERATIVE PLAN: Mr. Autry will be when he percent weightbearing to the left lower extremity and full extension.  We will keep the brace in locked extension for 2 weeks before beginning physical therapy.  He will get twice a day aspirin for DVT prophylaxis for 6 weeks.  He will be admitted overnight for observation and pain control and will follow up in my office in 2 weeks for wound check and staple removal.
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
## 2017-07-23 ENCOUNTER — Encounter (HOSPITAL_COMMUNITY): Payer: Self-pay | Admitting: Orthopedic Surgery

## 2017-08-02 ENCOUNTER — Encounter: Payer: Self-pay | Admitting: Physical Therapy

## 2017-08-02 ENCOUNTER — Ambulatory Visit: Payer: Medicaid Other | Attending: Orthopedic Surgery | Admitting: Physical Therapy

## 2017-08-02 DIAGNOSIS — Z4789 Encounter for other orthopedic aftercare: Secondary | ICD-10-CM | POA: Diagnosis not present

## 2017-08-02 DIAGNOSIS — M25662 Stiffness of left knee, not elsewhere classified: Secondary | ICD-10-CM | POA: Diagnosis present

## 2017-08-02 DIAGNOSIS — M2392 Unspecified internal derangement of left knee: Secondary | ICD-10-CM | POA: Diagnosis not present

## 2017-08-02 DIAGNOSIS — M66262 Spontaneous rupture of extensor tendons, left lower leg: Secondary | ICD-10-CM | POA: Diagnosis not present

## 2017-08-02 DIAGNOSIS — S86812D Strain of other muscle(s) and tendon(s) at lower leg level, left leg, subsequent encounter: Secondary | ICD-10-CM

## 2017-08-02 DIAGNOSIS — M25562 Pain in left knee: Secondary | ICD-10-CM | POA: Insufficient documentation

## 2017-08-02 DIAGNOSIS — R262 Difficulty in walking, not elsewhere classified: Secondary | ICD-10-CM | POA: Insufficient documentation

## 2017-08-02 DIAGNOSIS — X58XXXD Exposure to other specified factors, subsequent encounter: Secondary | ICD-10-CM | POA: Diagnosis not present

## 2017-08-02 DIAGNOSIS — S68612D Complete traumatic transphalangeal amputation of right middle finger, subsequent encounter: Secondary | ICD-10-CM | POA: Diagnosis not present

## 2017-08-02 DIAGNOSIS — R600 Localized edema: Secondary | ICD-10-CM

## 2017-08-05 ENCOUNTER — Encounter: Payer: Self-pay | Admitting: Physical Therapy
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
## 2017-08-14 ENCOUNTER — Ambulatory Visit: Payer: Medicaid Other | Admitting: Physical Therapy

## 2017-08-15 ENCOUNTER — Encounter: Payer: Self-pay | Admitting: Physical Therapy

## 2017-08-15 ENCOUNTER — Ambulatory Visit: Payer: Medicaid Other | Admitting: Physical Therapy

## 2017-08-15 DIAGNOSIS — M25662 Stiffness of left knee, not elsewhere classified: Secondary | ICD-10-CM

## 2017-08-15 DIAGNOSIS — S68612D Complete traumatic transphalangeal amputation of right middle finger, subsequent encounter: Secondary | ICD-10-CM | POA: Diagnosis not present

## 2017-08-15 DIAGNOSIS — R262 Difficulty in walking, not elsewhere classified: Secondary | ICD-10-CM

## 2017-08-15 DIAGNOSIS — M25562 Pain in left knee: Secondary | ICD-10-CM

## 2017-08-15 DIAGNOSIS — R600 Localized edema: Secondary | ICD-10-CM

## 2017-08-15 DIAGNOSIS — S86812D Strain of other muscle(s) and tendon(s) at lower leg level, left leg, subsequent encounter: Secondary | ICD-10-CM
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
## 2017-08-22 ENCOUNTER — Ambulatory Visit: Payer: Medicaid Other | Attending: Orthopedic Surgery | Admitting: Physical Therapy

## 2017-08-22 ENCOUNTER — Encounter: Payer: Self-pay | Admitting: Physical Therapy

## 2017-08-22 DIAGNOSIS — M25662 Stiffness of left knee, not elsewhere classified: Secondary | ICD-10-CM

## 2017-08-22 DIAGNOSIS — S86812D Strain of other muscle(s) and tendon(s) at lower leg level, left leg, subsequent encounter: Secondary | ICD-10-CM | POA: Diagnosis present

## 2017-08-22 DIAGNOSIS — M25562 Pain in left knee: Secondary | ICD-10-CM | POA: Diagnosis not present

## 2017-08-22 DIAGNOSIS — R262 Difficulty in walking, not elsewhere classified: Secondary | ICD-10-CM | POA: Diagnosis not present

## 2017-08-22 DIAGNOSIS — X58XXXD Exposure to other specified factors, subsequent encounter: Secondary | ICD-10-CM | POA: Insufficient documentation

## 2017-08-22 DIAGNOSIS — R600 Localized edema: Secondary | ICD-10-CM | POA: Diagnosis not present
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
## 2017-08-28 ENCOUNTER — Encounter: Payer: Medicaid Other | Admitting: Physical Therapy

## 2017-09-05 ENCOUNTER — Ambulatory Visit: Payer: Medicaid Other | Admitting: Physical Therapy

## 2017-09-11 ENCOUNTER — Ambulatory Visit: Payer: Medicaid Other | Admitting: Physical Therapy

## 2017-09-30 ENCOUNTER — Encounter (HOSPITAL_COMMUNITY): Payer: Self-pay | Admitting: Emergency Medicine

## 2017-09-30 DIAGNOSIS — F17200 Nicotine dependence, unspecified, uncomplicated: Secondary | ICD-10-CM | POA: Insufficient documentation

## 2017-09-30 DIAGNOSIS — Z79899 Other long term (current) drug therapy: Secondary | ICD-10-CM | POA: Insufficient documentation

## 2017-09-30 DIAGNOSIS — R51 Headache: Secondary | ICD-10-CM | POA: Diagnosis present

## 2017-09-30 NOTE — ED Triage Notes (Signed)
Pt c/o migraine that started this morning. Denies vision changes, or sensitivity to light and sound. Hx of migraines.

## 2017-10-01 ENCOUNTER — Emergency Department (HOSPITAL_COMMUNITY)
Admission: EM | Admit: 2017-10-01 | Discharge: 2017-10-01 | Disposition: A | Discharge status: Home / Self Care | Payer: Medicaid Other | Attending: Emergency Medicine | Admitting: Emergency Medicine

## 2017-10-01 DIAGNOSIS — R519 Headache, unspecified: Secondary | ICD-10-CM

## 2017-10-01 DIAGNOSIS — R51 Headache: Secondary | ICD-10-CM

## 2017-10-01 MED ORDER — KETOROLAC TROMETHAMINE 60 MG/2ML IM SOLN
60.0000 mg | Freq: Once | INTRAMUSCULAR | Status: AC
  Administered 2017-10-01: 60 mg via INTRAMUSCULAR
  Filled 2017-10-01: qty 2
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
## 2017-10-01 NOTE — Discharge Instructions (Signed)
May wish to try excedrin migraine if headaches recur. Follow-up with your primary care doctor. Return here for any new/worsening symptoms.

## 2018-06-07 ENCOUNTER — Encounter (HOSPITAL_COMMUNITY): Payer: Self-pay | Admitting: Emergency Medicine

## 2018-06-07 ENCOUNTER — Emergency Department (HOSPITAL_COMMUNITY)
Admission: EM | Admit: 2018-06-07 | Discharge: 2018-06-07 | Disposition: A | Discharge status: Home / Self Care | Payer: Medicaid Other | Attending: Emergency Medicine | Admitting: Emergency Medicine

## 2018-06-07 ENCOUNTER — Emergency Department (HOSPITAL_COMMUNITY): Payer: Medicaid Other

## 2018-06-07 DIAGNOSIS — F17200 Nicotine dependence, unspecified, uncomplicated: Secondary | ICD-10-CM | POA: Diagnosis not present

## 2018-06-07 DIAGNOSIS — G43009 Migraine without aura, not intractable, without status migrainosus: Secondary | ICD-10-CM | POA: Diagnosis not present

## 2018-06-07 DIAGNOSIS — R0789 Other chest pain: Secondary | ICD-10-CM | POA: Diagnosis not present

## 2018-06-07 DIAGNOSIS — R079 Chest pain, unspecified: Secondary | ICD-10-CM | POA: Diagnosis present

## 2018-06-07 LAB — CBC
HEMATOCRIT: 44.2 % (ref 39.0–52.0)
Hemoglobin: 14.6 g/dL (ref 13.0–17.0)
MCH: 34.6 pg — AB (ref 26.0–34.0)
MCHC: 33 g/dL (ref 30.0–36.0)
MCV: 104.7 fL — AB (ref 78.0–100.0)
PLATELETS: 207 10*3/uL (ref 150–400)
RBC: 4.22 MIL/uL (ref 4.22–5.81)
RDW: 12.7 % (ref 11.5–15.5)
WBC: 6.4 10*3/uL (ref 4.0–10.5)

## 2018-06-07 LAB — BASIC METABOLIC PANEL
Anion gap: 9 (ref 5–15)
BUN: 8 mg/dL (ref 6–20)
CHLORIDE: 104 mmol/L (ref 98–111)
CO2: 29 mmol/L (ref 22–32)
CREATININE: 1.07 mg/dL (ref 0.61–1.24)
Calcium: 8.7 mg/dL — ABNORMAL LOW (ref 8.9–10.3)
GFR calc non Af Amer: >60 mL/min (ref >60)
Glucose, Bld: 107 mg/dL — ABNORMAL HIGH (ref 70–99)
POTASSIUM: 3.2 mmol/L — AB (ref 3.5–5.1)
Sodium: 142 mmol/L (ref 135–145)

## 2018-06-07 LAB — I-STAT TROPONIN, ED: Troponin i, poc: 0 ng/mL (ref 0.00–0.08)

## 2018-06-07 MED ORDER — KETOROLAC TROMETHAMINE 30 MG/ML IJ SOLN
30.0000 mg | Freq: Once | INTRAMUSCULAR | Status: AC
  Administered 2018-06-07: 30 mg via INTRAVENOUS
  Filled 2018-06-07: qty 1

## 2018-06-07 MED ORDER — SUMATRIPTAN SUCCINATE 50 MG PO TABS: 50.0000 mg | ORAL_TABLET | ORAL | 0 refills | Status: AC | PRN

## 2018-06-07 MED ORDER — METOCLOPRAMIDE HCL 5 MG/ML IJ SOLN
10.0000 mg | Freq: Once | INTRAMUSCULAR | Status: AC
  Administered 2018-06-07: 10 mg via INTRAVENOUS
  Filled 2018-06-07: qty 2

## 2018-06-07 MED ORDER — POTASSIUM CHLORIDE 10 MEQ/100ML IV SOLN
10.0000 meq | Freq: Once | INTRAVENOUS | Status: AC
  Administered 2018-06-07: 10 meq via INTRAVENOUS
  Filled 2018-06-07: qty 100

## 2018-06-07 MED ORDER — DIPHENHYDRAMINE HCL 50 MG/ML IJ SOLN
25.0000 mg | Freq: Once | INTRAMUSCULAR | Status: AC
  Administered 2018-06-07: 25 mg via INTRAVENOUS
  Filled 2018-06-07: qty 1

## 2018-06-07 MED ORDER — SODIUM CHLORIDE 0.9 % IV BOLUS
1000.0000 mL | Freq: Once | INTRAVENOUS | Status: AC
  Administered 2018-06-07: 1000 mL via INTRAVENOUS

## 2018-06-07 NOTE — ED Notes (Signed)
Pt a/ox4, vss, nad, pt verbalized understanding of dc instructions and prescriptions. Ambulatory to lobby upon discharge

## 2018-06-07 NOTE — ED Triage Notes (Signed)
Pt reports chest pain that woke him up from sleep as well as severe headache. Denies sob, a/ox4.

## 2018-06-07 NOTE — Discharge Instructions (Addendum)
You may continue to use ibuprofen that is prescribed by your PCP for the headaches. I have given you a short course of Imitrex which you can try if your headache does not get better with ibuprofen.  If your headaches continue to get more frequent, you may want to talk with your PCP about preventive medication for migraines.

## 2018-06-07 NOTE — ED Notes (Signed)
Patient c/o HA
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
## 2019-04-20 IMAGING — CT CT KNEE*L* W/O CM
2 series · 10 of 14 positions shown, 12 images · non-contrast
Comparison: Radiographs dated 07/01/2017

CLINICAL DATA: Left knee pain since an injury playing basketball on
07/01/2017.

EXAM:
CT OF THE LEFT KNEE WITHOUT CONTRAST
TECHNIQUE: Multidetector CT imaging of the left knee was performed according to
the standard protocol. Multiplanar CT image reconstructions were
also generated.

[Series 3: bone lower extremity · axial · 0.44mm/px · z∈[-286,-94]mm · 5 of 146 slices shown, 7 images]
[im 25/146  soft-tissue]
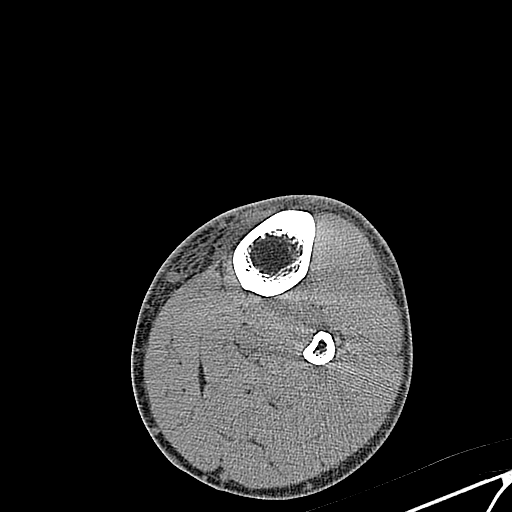
[im 25/146  bone]
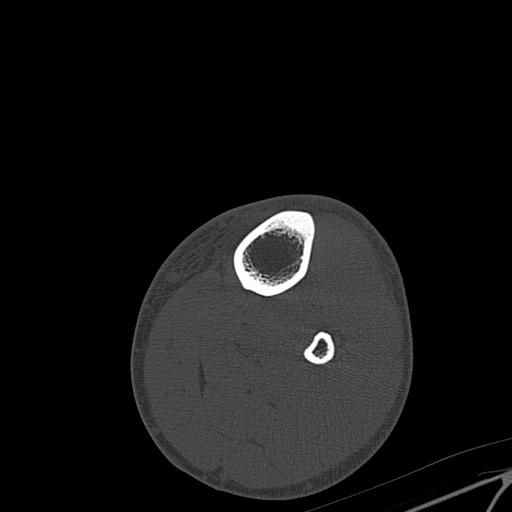
[im 49/146  bone]
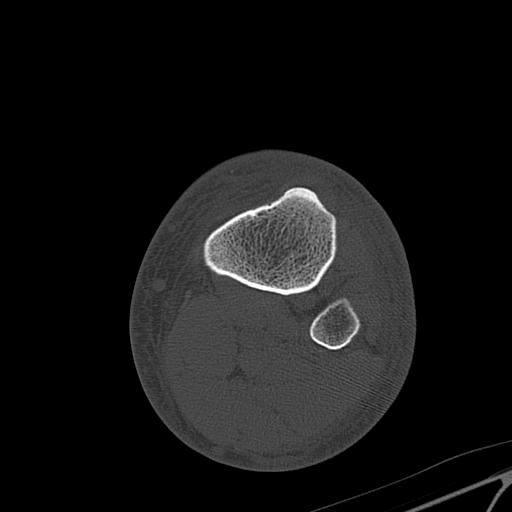
[im 73/146  bone]
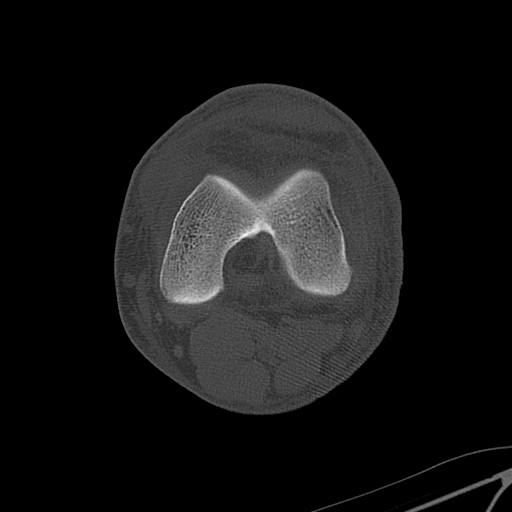
[im 97/146  bone]
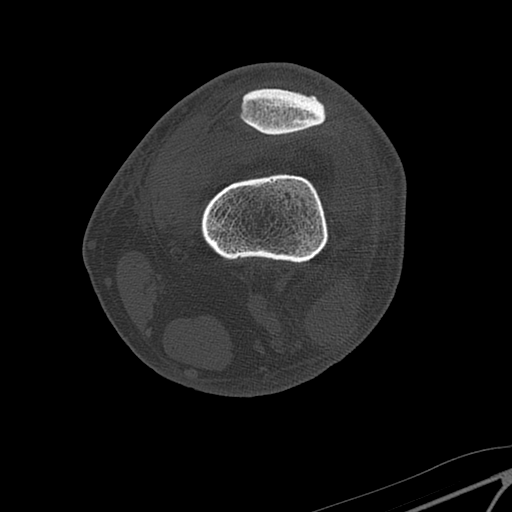
[im 121/146  soft-tissue]
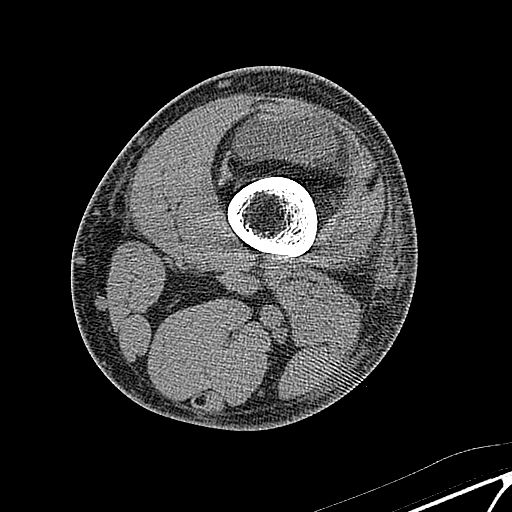
[im 121/146  bone]
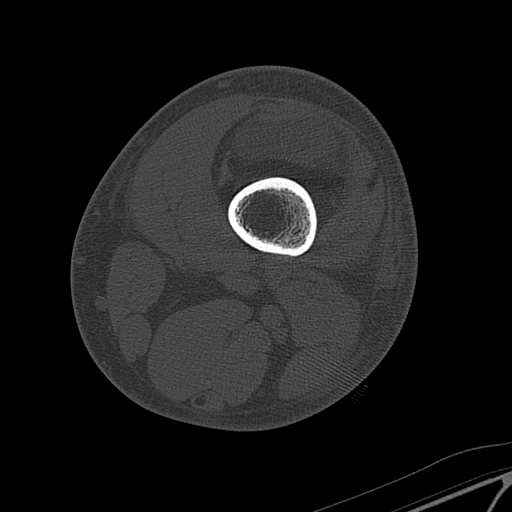

[Series 4: soft tissue lower extremity · axial · 0.48mm/px · z∈[-274,-90]mm · 5 of 138 slices shown]
[im 23/138  soft-tissue]
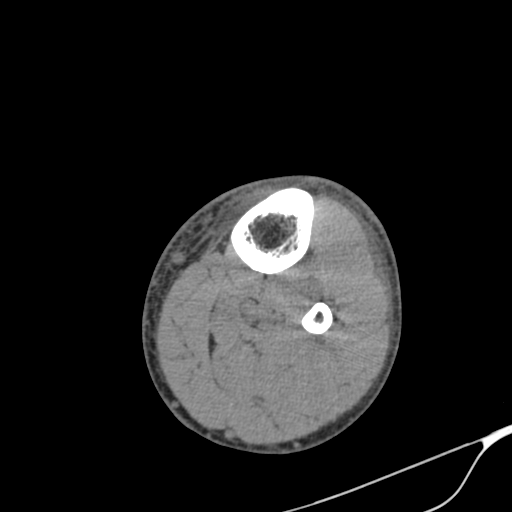
[im 46/138  soft-tissue]
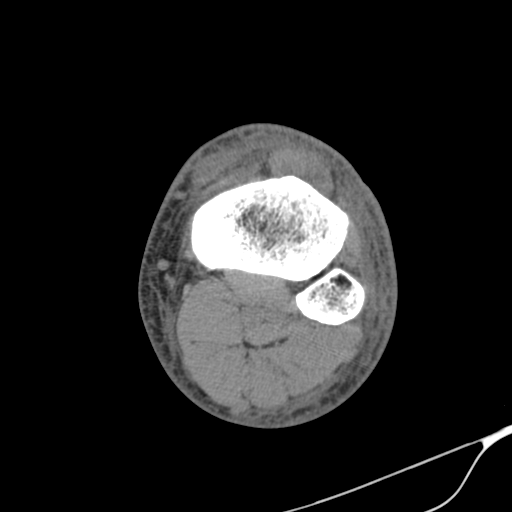
[im 69/138  soft-tissue]
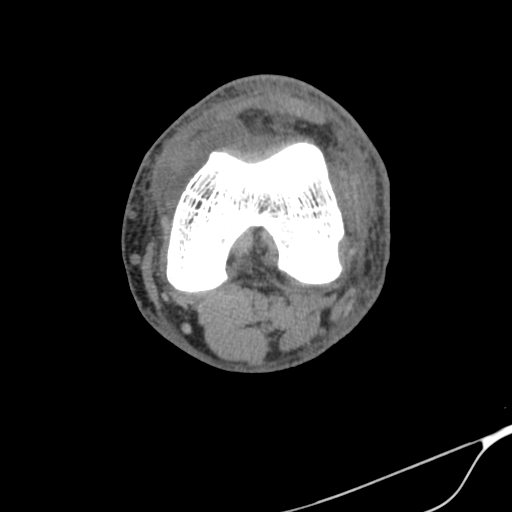
[im 92/138  soft-tissue]
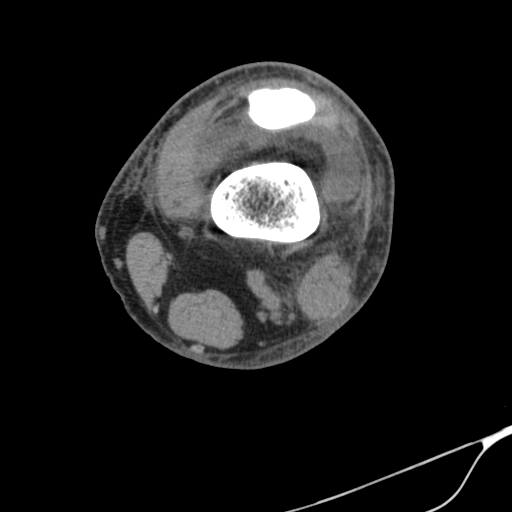
[im 115/138  soft-tissue]
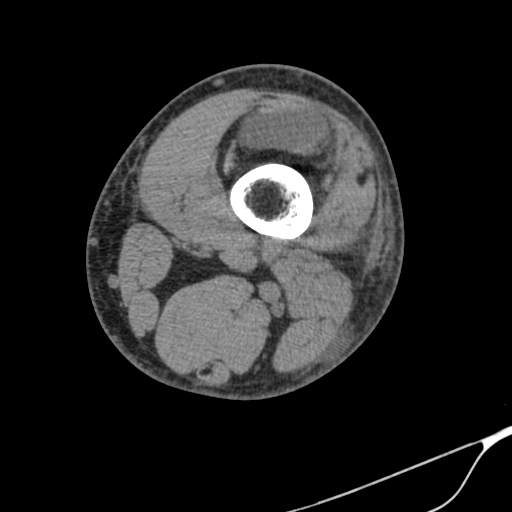

[10 of 14 positions shown; findings below may reference images not displayed]

FINDINGS: Bones/Joint/Cartilage

No fractures or dislocations.  Patella Alta.  Large joint effusion.

Ligaments

Suboptimally assessed by CT. The cruciate ligaments and collateral
ligaments appear to be intact.

Muscles and Tendons

Disruption of the mid substance of the patellar tendon. This is
probably complete given the redundancy of the proximal and distal
portions of the tendon.

Soft tissues

Hematoma in the soft tissues around the torn patellar tendon.
IMPRESSION: 1. Complete disruption of the mid substance of the patellar tendon
with secondary patella Alta.
2. Large joint effusion.
3. Hematoma in the soft tissues adjacent to the patellar tendon
rupture.

## 2019-08-09 ENCOUNTER — Emergency Department (HOSPITAL_COMMUNITY)
Admission: EM | Admit: 2019-08-09 | Discharge: 2019-08-09 | Disposition: A | Discharge status: Home / Self Care | Payer: Medicaid Other | Attending: Emergency Medicine | Admitting: Emergency Medicine

## 2019-08-09 ENCOUNTER — Encounter (HOSPITAL_COMMUNITY): Payer: Self-pay

## 2019-08-09 ENCOUNTER — Other Ambulatory Visit: Payer: Self-pay

## 2019-08-09 ENCOUNTER — Emergency Department (HOSPITAL_COMMUNITY): Payer: Medicaid Other

## 2019-08-09 DIAGNOSIS — M228X2 Other disorders of patella, left knee: Secondary | ICD-10-CM | POA: Insufficient documentation

## 2019-08-09 DIAGNOSIS — Z79899 Other long term (current) drug therapy: Secondary | ICD-10-CM | POA: Diagnosis not present

## 2019-08-09 DIAGNOSIS — R936 Abnormal findings on diagnostic imaging of limbs: Secondary | ICD-10-CM

## 2019-08-09 DIAGNOSIS — M25562 Pain in left knee: Secondary | ICD-10-CM | POA: Insufficient documentation

## 2019-08-09 DIAGNOSIS — F17210 Nicotine dependence, cigarettes, uncomplicated: Secondary | ICD-10-CM | POA: Insufficient documentation

## 2019-08-09 MED ORDER — IBUPROFEN 600 MG PO TABS: 600.0000 mg | ORAL_TABLET | Freq: Four times a day (QID) | ORAL | 0 refills | Status: DC | PRN

## 2019-08-09 MED ORDER — ACETAMINOPHEN 500 MG PO TABS: 500.0000 mg | ORAL_TABLET | Freq: Four times a day (QID) | ORAL | 0 refills | Status: AC | PRN

## 2019-08-09 NOTE — ED Triage Notes (Signed)
Patient complains of left knee pain for a few days after working out following knee surgery several months ago

## 2019-08-09 NOTE — ED Notes (Signed)
Patient verbalizes understanding of discharge instructions. Opportunity for questioning and answers were provided. Armband removed by staff, pt discharged from ED. Ambulated out to lobby  
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
## 2019-08-09 NOTE — Discharge Instructions (Signed)
1. Medications: You can take 1 to 2 tablets of Tylenol (350mg -1000mg  depending on the dose) every 6 hours as needed for pain.  Do not exceed 4000 mg of Tylenol daily.  If your pain persists you can take a doses of ibuprofen in between doses of Tylenol.  I usually recommend 400 to 600 mg of ibuprofen every 6 hours.  Take this with food to avoid upset stomach issues. 2. Treatment: rest, ice, elevate and use ace wrap/crutches, drink plenty of fluids, gentle stretching 3. Follow Up: Please followup with orthopedics as directed, call tomorrow morning to set up a follow-up appointment for soon as possible; Please return to the ER for worsening symptoms or other concerns such as worsening swelling, redness of the skin, fevers, loss of pulses, or loss of feeling

## 2022-04-10 ENCOUNTER — Emergency Department (HOSPITAL_COMMUNITY)
Admission: EM | Admit: 2022-04-10 | Discharge: 2022-04-10 | Disposition: A | Discharge status: Home / Self Care | Payer: Medicaid Other | Attending: Emergency Medicine | Admitting: Emergency Medicine

## 2022-04-10 ENCOUNTER — Encounter (HOSPITAL_COMMUNITY): Payer: Self-pay | Admitting: Emergency Medicine

## 2022-04-10 ENCOUNTER — Other Ambulatory Visit: Payer: Self-pay

## 2022-04-10 ENCOUNTER — Emergency Department (HOSPITAL_COMMUNITY): Payer: Medicaid Other

## 2022-04-10 DIAGNOSIS — W268XXA Contact with other sharp object(s), not elsewhere classified, initial encounter: Secondary | ICD-10-CM | POA: Insufficient documentation

## 2022-04-10 DIAGNOSIS — Z23 Encounter for immunization: Secondary | ICD-10-CM | POA: Diagnosis not present

## 2022-04-10 DIAGNOSIS — S99921A Unspecified injury of right foot, initial encounter: Secondary | ICD-10-CM | POA: Diagnosis present

## 2022-04-10 MED ORDER — BACITRACIN ZINC 500 UNIT/GM EX OINT: TOPICAL_OINTMENT | Freq: Two times a day (BID) | CUTANEOUS | Status: DC

## 2022-04-10 MED ORDER — TETANUS-DIPHTH-ACELL PERTUSSIS 5-2.5-18.5 LF-MCG/0.5 IM SUSY
0.5000 mL | PREFILLED_SYRINGE | Freq: Once | INTRAMUSCULAR | Status: AC
  Administered 2022-04-10: 0.5 mL via INTRAMUSCULAR
  Filled 2022-04-10: qty 0.5

## 2022-04-10 MED ORDER — NAPROXEN 250 MG PO TABS
375.0000 mg | ORAL_TABLET | Freq: Once | ORAL | Status: AC
  Administered 2022-04-10: 375 mg via ORAL
  Filled 2022-04-10: qty 2

## 2022-04-10 MED ORDER — CEPHALEXIN 500 MG PO CAPS: 500.0000 mg | ORAL_CAPSULE | Freq: Three times a day (TID) | ORAL | 0 refills | Status: AC

## 2022-04-10 MED ORDER — NAPROXEN 375 MG PO TBEC: 375.0000 mg | DELAYED_RELEASE_TABLET | Freq: Two times a day (BID) | ORAL | 0 refills | Status: AC | PRN

## 2022-04-10 MED ORDER — CEPHALEXIN 250 MG PO CAPS
500.0000 mg | ORAL_CAPSULE | Freq: Once | ORAL | Status: AC
  Administered 2022-04-10: 500 mg via ORAL
  Filled 2022-04-10: qty 2

## 2022-04-10 NOTE — ED Provider Notes (Signed)
[place] EMERGENCY DEPARTMENT Provider Note   CSN: [id number] Arrival date & time: 04/10/22  [id number]     History  Chief Complaint  Patient presents with   Foot Injury    Jerry [name] is a 36 y.o. male with a history of schizophrenia and tobacco use who presents to the emergency department with complaints of right foot injury that occurred yesterday morning.  Patient reports that he accidentally stepped on a nail trimmer and that the blade went into his foot.  He approximates that this went in about half a centimeter.  He subsequently removed it with some difficulty.  He has been having pain to this area.  Worse with weightbearing, no alleviating factors.  The nail clipper did not go through any shoe.  He is unsure of his last tetanus.  He denies other areas of injury.  Denies numbness, tingling, weakness, fever, chills, or drainage.  HPI     Home Medications Prior to Admission medications   Medication Sig Start Date End Date Taking? Authorizing Provider  acetaminophen (TYLENOL) 500 MG tablet Take 1 tablet (500 mg total) by mouth every 6 (six) hours as needed. 08/09/19   Fawze, Mina A, PA-C  amantadine (SYMMETREL) 100 MG capsule Take 1 capsule (100 mg total) by mouth 2 (two) times daily. Patient not taking: Reported on 06/07/2018 04/10/17   Kerrie Buffalo, NP  ARIPiprazole (ABILIFY) 5 MG tablet Take 1 tablet (5 mg total) by mouth daily. Take 1 tablet (5 mg ) in the morning.  Then take 2 tablets (10 mg)  in the evening. Patient not taking: Reported on 06/07/2018 04/11/17   Kerrie Buffalo, NP  ARIPiprazole ER 400 MG SRER Inject 400 mg into the muscle every 28 (twenty-eight) days. Next dose due 04/29/2017. Patient not taking: Reported on 06/07/2018 04/29/17   Kerrie Buffalo, NP  divalproex (DEPAKOTE ER) 500 MG 24 hr tablet Take 2 tablets (1,000 mg total) by mouth at bedtime. Patient not taking: Reported on 06/07/2018 04/10/17   Kerrie Buffalo, NP   HYDROcodone-acetaminophen [facility]) 5-325 MG tablet Take 1-2 tablets by mouth every 6 (six) hours as needed. Patient not taking: Reported on 06/07/2018 07/01/17   Veryl Speak, MD  ibuprofen (ADVIL) 600 MG tablet Take 1 tablet (600 mg total) by mouth every 6 (six) hours as needed. 08/09/19   Fawze, Mina A, PA-C  methocarbamol (ROBAXIN) 500 MG tablet Take 1 tablet (500 mg total) by mouth every 6 (six) hours as needed for muscle spasms. Patient not taking: Reported on 06/07/2018 07/20/17   Nicholes Stairs, MD  nicotine (NICODERM CQ - DOSED IN MG/24 HOURS) 21 mg/24hr patch Place 1 patch (21 mg total) onto the skin daily. Patient not taking: Reported on 06/07/2018 04/11/17   Kerrie Buffalo, NP  ondansetron (ZOFRAN ODT) 4 MG disintegrating tablet Take 1 tablet (4 mg total) by mouth every 8 (eight) hours as needed. Patient not taking: Reported on 06/07/2018 07/20/17   Nicholes Stairs, MD  oxyCODONE (OXY IR/ROXICODONE) 5 MG immediate release tablet Take 1-2 tablets (5-10 mg total) by mouth every 4 (four) hours as needed for breakthrough pain. Patient not taking: Reported on 06/07/2018 07/20/17   Nicholes Stairs, MD  SUMAtriptan (IMITREX) 50 MG tablet Take 1 tablet (50 mg total) by mouth every 2 (two) hours as needed for up to 10 doses for migraine. May repeat in 2 hours if headache persists or recurs. 06/07/18   Mortis, Alvie Heidelberg I, PA-C  traZODone (DESYREL) 50 MG tablet Take  1 tablet (50 mg total) by mouth at bedtime and may repeat dose one time if needed. Patient not taking: Reported on 06/07/2018 04/10/17   Kerrie Buffalo, NP      Allergies    Cogentin [benztropine], Risperdal [risperidone], and Trazodone and nefazodone    Review of Systems   Review of Systems  Constitutional:  Negative for chills and fever.  Musculoskeletal:  Positive for arthralgias.  Skin:  Positive for wound. Negative for color change.  Neurological:  Negative for weakness and numbness.  All other systems reviewed and  are negative.  Physical Exam Updated Vital Signs BP 123/68 (BP Location: Right Arm)   Pulse 62   Temp 97.9 F (36.6 C) (Oral)   Resp 20   SpO2 99%  Physical Exam Vitals and nursing note reviewed.  Constitutional:      General: He is not in acute distress.    Appearance: He is not ill-appearing or toxic-appearing.  HENT:     Head: Normocephalic and atraumatic.  Eyes:     Comments: Left eye enucleated  Cardiovascular:     Pulses:          Dorsalis pedis pulses are 2+ on the right side and 2+ on the left side.       Posterior tibial pulses are 2+ on the right side and 2+ on the left side.  Pulmonary:     Effort: Pulmonary effort is normal.  Musculoskeletal:     Comments: Lower extremities: Patient has very small wound to the plantar aspect of the right forefoot that is approximately 0.5cm, no deep sq tissue exposure, no active bleeding, no visible FB, no significant puncture wound.  No surrounding erythema, warmth, or purulent drainage.  Wound area is tender to palpation.  Otherwise nontender.. Patient has intact AROM to bilateral hips, knees, ankles, and all digits.   Skin:    General: Skin is warm and dry.     Capillary Refill: Capillary refill takes less than 2 seconds.  Neurological:     Mental Status: He is alert.     Comments: Alert. Clear speech. Sensation grossly intact to bilateral lower extremities. 5/5 strength with plantar/dorsiflexion bilaterally. Patient ambulatory.  Psychiatric:        Mood and Affect: Mood normal.        Behavior: Behavior normal.    ED Results / Procedures / Treatments   Labs (all labs ordered are listed, but only abnormal results are displayed) Labs Reviewed - No data to display  EKG None  Radiology DG Foot Complete Right  Result Date: 04/10/2022 CLINICAL DATA:  Pain, injury, stepped on nail cutter. EXAM: RIGHT FOOT COMPLETE - 3+ VIEW COMPARISON:  None Available. FINDINGS: There is no evidence of fracture or dislocation. Degenerative  changes are present at the first metatarsophalangeal joint and midfoot. Soft tissues are unremarkable. IMPRESSION: No acute osseous abnormality. Electronically Signed   By: Brett Fairy M.D.   On: 04/10/2022 04:26    Procedures Procedures    Medications Ordered in ED Medications  Tdap (BOOSTRIX) injection 0.5 mL (0.5 mLs Intramuscular Given 04/10/22 0403)    ED Course/ Medical Decision Making/ A&P                           Medical Decision Making Amount and/or Complexity of Data Reviewed Radiology: ordered.  Risk Prescription drug management.   Patient presents to the emergency department with complaints of right foot injury that occurred yesterday morning.  He is nontoxic, resting comfortably, vitals within normal limits.  Chart/nursing note reviewed for additional history, most recent creatinine WNL.  I ordered, viewed, and interpreted imaging including right foot x-ray which shows no acute osseous abnormality.  No underlying fracture/dislocation, no radiopaque foreign bodies.  Patient's wound appears to be healing, there is no subcutaneous tissue exposure or significant puncture wound.  There are no current signs of infection.  The instrumentation leading to injury did not go through a shoe surface therefore do not feel that Pseudomonas coverage is necessary at this time. Will cover with short course of prophylactic abx. Tetanus will be updated in the ED. NSAIDs for pain. Wound cleansed in the ED. I discussed results, treatment plan, need for follow-up, and return precautions with the patient. Provided opportunity for questions, patient confirmed understanding and is in agreement with plan.   Final Clinical Impression(s) / ED Diagnoses Final diagnoses:  Injury of right foot, initial encounter    Rx / DC Orders ED Discharge Orders          Ordered    Naproxen (EC-NAPROXEN) 375 MG TBEC  2 times daily PRN        04/10/22 0610    cephALEXin (KEFLEX) 500 MG capsule  3 times  daily        04/10/22 0610              Anatole Apollo, Glynda Jaeger, PA-C 04/10/22 [id number]    Quintella Reichert, MD 04/10/22 [phone]

## 2022-04-10 NOTE — Progress Notes (Signed)
Orthopedic Tech Progress Note Patient Details:  Jerry [name] 07/03/1986 [id number]  Ortho Devices Type of Ortho Device: Crutches, Postop shoe/boot Ortho Device/Splint Location: rle Ortho Device/Splint Interventions: Ordered, Application, Adjustment   Post Interventions Patient Tolerated: Well Instructions Provided: Care of device, Adjustment of device  Karolee Stamps 04/10/2022, 6:55 AM

## 2022-04-10 NOTE — ED Triage Notes (Signed)
Patient injured his right foot stepped on a nail cutter yesterday morning , no bleeding at arrival [facility]

## 2022-04-10 NOTE — ED Provider Triage Note (Signed)
Emergency Medicine Provider Triage Evaluation Note  Jerry [name] , a 36 y.o. male  was evaluated in triage.  Pt complains of right foot injury. Stepped on a nail cutter yesterday which broke the skin, had some trouble removing it and has had pain since  Review of Systems  Positive: Arthralgia, wound Negative: Numbness, weakness  Physical Exam  BP 123/68 (BP Location: Right Arm)   Pulse 62   Temp 97.9 F (36.6 C) (Oral)   Resp 20   SpO2 99%  Gen:   Awake, no distress   Resp:  Normal effort  MSK:   Moves extremities without difficulty  Other:  Small wound to plantar aspect of the foot. No SQ tissue exposure or active bleeding. No visible FB. TTP.   Medical Decision Making  Medically screening exam initiated at 3:59 AM.  Appropriate orders placed.  Adline Mango was informed that the remainder of the evaluation will be completed by another provider, this initial triage assessment does not replace that evaluation, and the importance of remaining in the ED until their evaluation is complete.  Foot pain.    Nicodemus Denk R, PA-C 04/10/22 0400

## 2022-04-10 NOTE — Discharge Instructions (Addendum)
Follow attached wound care instructions.  Take keflex to help prevent infection.  Take naproxen as needed for pain.   - Naproxen- this is a nonsteroidal anti-inflammatory medication that will help with pain and swelling. Be sure to take this medication as prescribed with food, 1 pill every 12 hours,  It should be taken with food, as it can cause stomach upset, and more seriously, stomach bleeding. Do not take other nonsteroidal anti-inflammatory medications with this such as Advil, Motrin, Aleve, Mobic, Goodie Powder, or Motrin etc..    You make take Tylenol per over the counter dosing with these medications.   We have prescribed you new medication(s) today. Discuss the medications prescribed today with your pharmacist as they can have adverse effects and interactions with your other medicines including over the counter and prescribed medications. Seek medical evaluation if you start to experience new or abnormal symptoms after taking one of these medicines, seek care immediately if you start to experience difficulty breathing, feeling of your throat closing, facial swelling, or rash as these could be indications of a more serious allergic reaction   Keep the foot elevated, try to rest.   Follow up for recheck with primary care within 3 days. Return to the ED for new or worsening symptoms including but not limited to new or worsening pain, fever, redness, drainage, or any other concerns.
